# Patient Record
Sex: Male | Born: 1995 | Race: Black or African American | Hispanic: No | Marital: Single | Smoking: Never smoker
Health system: Southern US, Community
[De-identification: ages and names within clinical notes are randomized; demographics above are authoritative.]

## PROBLEM LIST (undated history)

## (undated) ENCOUNTER — Emergency Department (HOSPITAL_COMMUNITY): Disposition: A | Discharge status: Home / Self Care | Payer: Federal, State, Local not specified - PPO

---
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
## 2013-01-30 ENCOUNTER — Other Ambulatory Visit: Payer: Self-pay

## 2013-02-21 ENCOUNTER — Ambulatory Visit: Payer: Federal, State, Local not specified - PPO | Admitting: Family Medicine

## 2013-02-21 ENCOUNTER — Emergency Department (HOSPITAL_COMMUNITY)
Admission: EM | Admit: 2013-02-21 | Discharge: 2013-02-21 | Disposition: A | Discharge status: Home / Self Care | Payer: Federal, State, Local not specified - PPO | Attending: Emergency Medicine | Admitting: Emergency Medicine

## 2013-02-21 ENCOUNTER — Encounter: Payer: Self-pay | Admitting: Family Medicine

## 2013-02-21 ENCOUNTER — Emergency Department (HOSPITAL_COMMUNITY): Payer: Federal, State, Local not specified - PPO

## 2013-02-21 ENCOUNTER — Encounter (HOSPITAL_COMMUNITY): Payer: Self-pay | Admitting: Emergency Medicine

## 2013-02-21 ENCOUNTER — Ambulatory Visit (INDEPENDENT_AMBULATORY_CARE_PROVIDER_SITE_OTHER): Payer: Federal, State, Local not specified - PPO | Admitting: Family Medicine

## 2013-02-21 VITALS — BP 115/68 | HR 77 | Temp 97.8°F | Wt 156.0 lb

## 2013-02-21 DIAGNOSIS — K920 Hematemesis: Secondary | ICD-10-CM | POA: Insufficient documentation

## 2013-02-21 DIAGNOSIS — J111 Influenza due to unidentified influenza virus with other respiratory manifestations: Secondary | ICD-10-CM | POA: Insufficient documentation

## 2013-02-21 DIAGNOSIS — J029 Acute pharyngitis, unspecified: Secondary | ICD-10-CM | POA: Insufficient documentation

## 2013-02-21 DIAGNOSIS — E876 Hypokalemia: Secondary | ICD-10-CM

## 2013-02-21 DIAGNOSIS — A084 Viral intestinal infection, unspecified: Secondary | ICD-10-CM

## 2013-02-21 DIAGNOSIS — R042 Hemoptysis: Secondary | ICD-10-CM

## 2013-02-21 DIAGNOSIS — R197 Diarrhea, unspecified: Secondary | ICD-10-CM | POA: Insufficient documentation

## 2013-02-21 DIAGNOSIS — A088 Other specified intestinal infections: Secondary | ICD-10-CM

## 2013-02-21 LAB — CBC WITH DIFFERENTIAL/PLATELET
Basophils Absolute: 0 10*3/uL (ref 0.0–0.1)
Eosinophils Absolute: 0.3 10*3/uL (ref 0.0–0.7)
Eosinophils Relative: 7 % — ABNORMAL HIGH (ref 0–5)
HCT: 43 % (ref 39.0–52.0)
Lymphocytes Relative: 45 % (ref 12–46)
Lymphs Abs: 1.8 10*3/uL (ref 0.7–4.0)
MCH: 27.4 pg (ref 26.0–34.0)
MCV: 82.9 fL (ref 78.0–100.0)
Monocytes Absolute: 0.6 10*3/uL (ref 0.1–1.0)
RDW: 13.3 % (ref 11.5–15.5)
WBC: 4.1 10*3/uL (ref 4.0–10.5)

## 2013-02-21 LAB — BASIC METABOLIC PANEL
CO2: 30 mEq/L (ref 19–32)
Calcium: 9.2 mg/dL (ref 8.4–10.5)
Creatinine, Ser: 1.01 mg/dL (ref 0.50–1.35)
Glucose, Bld: 95 mg/dL (ref 70–99)

## 2013-02-21 MED ORDER — BENZONATATE 100 MG PO CAPS: 100.0000 mg | ORAL_CAPSULE | Freq: Three times a day (TID) | ORAL | Status: DC

## 2013-02-21 NOTE — Patient Instructions (Signed)
Potassium Content of Foods Potassium is a mineral found in many foods and drinks. It helps keep fluids and minerals balanced in your body and also affects how steadily your heart beats. The body needs potassium to control blood pressure and to keep the muscles and nervous system healthy. However, certain health conditions and medicine may require you to eat more or less potassium-rich foods and drinks. Your caregiver or dietitian will tell you how much potassium you should have each day. COMMON SERVING SIZES The list below tells you how big or small common portion sizes are:  1 oz.........4 stacked dice.  3 oz.........Deck of cards.  1 tsp........Tip of little finger.  1 tbsp......Thumb.  2 tbsp......Golf ball.   c...........Half of a fist.  1 c............A fist. FOODS AND DRINKS HIGH IN POTASSIUM More than 200 mg of potassium per serving. A serving size is  c (120 mL or noted gram weight) unless otherwise stated. While all the items on this list are high in potassium, some items are higher in potassium than others. Fruits  Apricots (sliced), 83 g.  Apricots (dried halves), 3 oz / 24 g.  Avocado (cubed),  c / 50 g.  Banana (sliced), 75 g.  Cantaloupe (cubed), 80 g.  Dates (pitted), 5 whole / 35 g.  Figs (dried), 4 whole / 32 g.  Guava, c / 55 g.  Honeydew, 1 wedge / 85 g.  Kiwi (sliced), 90 g.  Nectarine, 1 small / 129 g.  Orange, 1 medium / 131 g.  Orange juice.  Pomegranate seeds, 87 g.  Pomegranate juice.  Prunes (pitted), 3 whole / 30 g.  Prune juice, 3 oz / 90 mL.  Seedless raisins, 3 tbsp / 27 g. Vegetables  Artichoke,  of a medium / 64 g.  Asparagus (boiled), 90 g.  Baked beans,  c / 63 g.  Bamboo shoots,  c / 38 g.  Beets (cooked slices), 85 g.  Broccoli (boiled), 78 g.  Brussels sprout (boiled), 78 g.  Butternut squash (baked), 103 g.  Chickpea (cooked), 82 g.  Green peas (cooked), 80 g.  Hubbard squash (baked cubes),  c /  68 g.  Kidney beans (cooked), 5 tbsp / 55 g.  Lima beans (cooked),  c / 43 g.  Navy beans (cooked),  c / 61 g.  Potato (baked), 61 g.  Potato (boiled), 78 g.  Pumpkin (boiled), 123 g.  Refried beans,  c / 79 g.  Spinach (cooked),  c / 45 g.  Split peas (cooked),  c / 65 g.  Sun-dried tomatoes, 2 tbsp / 7 g.  Sweet potato (baked),  c / 50 g.  Tomato (chopped or sliced), 90 g.  Tomato juice.  Tomato paste, 4 tsp / 21 g.  Tomato sauce,  c / 61 g.  Vegetable juice.  White mushrooms (cooked), 78 g.  Yam (cooked or baked),  c / 34 g.  Zucchini squash (boiled), 90 g. Other Foods and Drinks  Almonds (whole),  c / 36 g.  Cashews (oil roasted),  c / 32 g.  Chocolate milk.  Chocolate pudding, 142 g.  Clams (steamed), 1.5 oz / 43 g.  Dark chocolate, 1.5 oz / 42 g.  Fish, 3 oz / 85 g.  King crab (steamed), 3 oz / 85 g.  Lobster (steamed), 4 oz / 113 g.  Milk (skim, 1%, 2%, whole), 1 c / 240 mL.  Milk chocolate, 2.3 oz / 66 g.  Milk shake.  Nonfat fruit   variety yogurt, 123 g.  Peanuts (oil roasted), 1 oz / 28 g.  Peanut butter, 2 tbsp / 32 g.  Pistachio nuts, 1 oz / 28 g.  Pumpkin seeds, 1 oz / 28 g.  Red meat (broiled, cooked, grilled), 3 oz / 85 g.  Scallops (steamed), 3 oz / 85 g.  Shredded wheat cereal (dry), 3 oblong biscuits / 75 g.  Spaghetti sauce,  c / 66 g.  Sunflower seeds (dry roasted), 1 oz / 28 g.  Veggie burger, 1 patty / 70 g. FOODS MODERATE IN POTASSIUM Between 150 mg and 200 mg per serving. A serving is  c (120 mL or noted gram weight) unless otherwise stated. Fruits  Grapefruit,  of the fruit / 123 g.  Grapefruit juice.  Pineapple juice.  Plums (sliced), 83 g.  Tangerine, 1 large / 120 g. Vegetables  Carrots (boiled), 78 g.  Carrots (sliced), 61 g.  Rhubarb (cooked with sugar), 120 g.  Rutabaga (cooked), 120 g.  Sweet corn (cooked), 75 g.  Yellow snap beans (cooked), 63 g. Other Foods and  Drinks   Bagel, 1 bagel / 98 g.  Chicken breast (roasted and chopped),  c / 70 g.  Chocolate ice cream / 66 g.  Pita bread, 1 large / 64 g.  Shrimp (steamed), 4 oz / 113 g.  Swiss cheese (diced), 70 g.  Vanilla ice cream, 66 g.  Vanilla pudding, 140 g. FOODS LOW IN POTASSIUM Less than 150 mg per serving. A serving size is  cup (120 mL or noted gram weight) unless otherwise stated. If you eat more than 1 serving of a food low in potassium, the food may be considered a food high in potassium. Fruits  Apple (slices), 55 g.  Apple juice.  Applesauce, 122 g.  Blackberries, 72 g.  Blueberries, 74 g.  Cranberries, 50 g.  Cranberry juice.  Fruit cocktail, 119 g.  Fruit punch.  Grapes, 46 g.  Grape juice.  Mandarin oranges (canned), 126 g.  Peach (slices), 77 g.  Pineapple (chunks), 83 g.  Raspberries, 62 g.  Red cherries (without pits), 78 g.  Strawberries (sliced), 83 g.  Watermelon (diced), 76 g. Vegetables  Alfalfa sprouts, 17 g.  Bell peppers (sliced), 46 g.  Cabbage (shredded), 35 g.  Cauliflower (boiled), 62 g.  Celery, 51 g.  Collard greens (boiled), 95 g.  Cucumber (sliced), 52 g.  Eggplant (cubed), 41 g.  Green beans (boiled), 63 g.  Lettuce (shredded), 1 c / 36 g.  Onions (sauteed), 44 g.  Radishes (sliced), 58 g.  Spaghetti squash, 51 g. Other Foods and Drinks  Angel food cake, 1 slice / 28 g.  Black tea.  Suniga rice (cooked), 98 g.  Butter croissant, 1 medium / 57 g.  Carbonated soda.  Coffee.  Cheddar cheese (diced), 66 g.  Corn flake cereal (dry), 14 g.  Cottage cheese, 118 g.  Cream of rice cereal (cooked), 122 g.  Cream of wheat cereal (cooked), 126 g.  Crisped rice cereal (dry), 14 g.  Egg (boiled, fried, poached, omelet, scrambled), 1 large / 46 61 g.  English muffin, 1 muffin / 57 g.  Frozen ice pop, 1 pop / 55 g.  Graham cracker, 1 large rectangular cracker / 14 g.  Jelly beans, 112  g.  Non-dairy whipped topping.  Oatmeal, 88 g.  Orange sherbet, 74 g.  Puffed rice cereal (dry), 7 g.  Pasta (cooked), 70 g.  Rice cakes, 4 cakes / 36   g.  Sugared doughnut, 4 oz / 116 g.  White bread, 1 slice / 30 g.  White rice (cooked), 79 93 g.  Wild rice (cooked), 82 g.  Yellow cake, 1 slice / 68 g. Document Released: 10/25/2004 Document Revised: 02/28/2012 Document Reviewed: 07/28/2011 [employer] Patient Information 2014 [employer].  

## 2013-02-21 NOTE — Progress Notes (Signed)
   Subjective:    Patient ID: Erik Yang, male    DOB: [birth year], 17 y.o.   MRN: [id number]  HPI Emergency room followup. Patient developed couple days ago some vomiting and diarrhea. Nonbloody emesis. He was having several nonbloody stools. Yesterday coughed up a few red streaks of blood in sputum. No pleuritic pain. No dyspnea. No fever.  Patient had some cough for about one week. Cough worse at night. Nonsmoker. Patient states vomiting and diarrhea have resolved today. He had chest x-ray in emergency department and that was reviewed. No acute findings. Potassium 3.3. White blood cell count normal.  Patient is drinking and eating better today. No abdominal pain. No dyspnea. No pleuritic pain. No palpitations. No further hemoptysis today. No recent fevers or chills or night sweats.  Patient's girlfriend had similar symptoms couple days ago with vomiting and diarrhea. 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 He was prescribed Tessalon Perles but has not yet taken  No past medical history on file. No past surgical history on file.  reports that he has never smoked. He does not have any smokeless tobacco history on file. He reports that he does not drink alcohol or use illicit drugs. family history is not on file. No Known Allergies    Review of Systems  Constitutional: Positive for fatigue. Negative for fever and chills.  HENT: Negative for congestion.   Respiratory: Positive for cough. Negative for shortness of breath.   Gastrointestinal: Positive for nausea, vomiting and diarrhea. Negative for abdominal pain, blood in stool and abdominal distention.  Endocrine: Negative for polydipsia and polyuria.  Neurological: Negative for dizziness and syncope.       Objective:   Physical Exam  Constitutional: He appears well-developed and well-nourished.  HENT:  Mouth/Throat: Oropharynx is clear and moist.  Neck: Neck supple.  No thyromegaly present.  Cardiovascular: Normal rate and regular rhythm.  Exam reveals no gallop.   No murmur heard. Pulmonary/Chest: Effort normal and breath sounds normal. No respiratory distress. He has no wheezes. He has no rales.  Abdominal: Soft. Bowel sounds are normal. He exhibits no distension and no mass. There is no tenderness. There is no rebound and no guarding.  Musculoskeletal: He exhibits no edema.  Lymphadenopathy:    He has no cervical adenopathy.  Skin: No rash noted.          Assessment & Plan:  #1 resolving viral gastroenteritis. Vomiting and diarrhea have resolved at this time. Bland diet and advance slowly as tolerated. #2 cough with episode of small volume hemoptysis probably related to acute viral bronchitis. Nonfocal exam and recent chest x-ray unremarkable. He does not have any red flags for worrisome issues such as pulmonary embolus and has no fever this time. Continue Tessalon as needed for cough #3 mild hypokalemia related to #1. Potassium rich diet recommended

## 2013-02-21 NOTE — Progress Notes (Signed)
Pre visit review using our clinic review tool, if applicable. No additional management support is needed unless otherwise documented below in the visit note. 

## 2013-02-21 NOTE — ED Notes (Addendum)
Pt c/o blood in urine, vomit and sputum.  Pt c/o constant emesis 11/27, Pt c/o of congested cough

## 2013-02-21 NOTE — ED Provider Notes (Signed)
CSN: [id number]     Arrival date & time 02/21/13  0211 History   First MD Initiated Contact with Patient 02/21/13 [phone]     Chief Complaint  Patient presents with  . Hemoptysis  . Hematemesis   (Consider location/radiation/quality/duration/timing/severity/associated sxs/prior Treatment) HPI Comments: Starting having small red streaks in sputum yesterday  Patient is a 17 y.o. male presenting with URI.  URI Presenting symptoms: congestion, cough, rhinorrhea and sore throat   Presenting symptoms: no fatigue and no fever   Severity:  Moderate Duration:  1 week Timing:  Constant Progression:  Worsening Chronicity:  New Relieved by:  Nothing Worsened by:  Nothing tried Associated symptoms: no headaches, no neck pain, no swollen glands and no wheezing   Associated symptoms comment:  Vomiting, diarrhea Risk factors: no immunosuppression, no recent illness, no recent travel and no sick contacts     History reviewed. No pertinent past medical history. History reviewed. No pertinent past surgical history. History reviewed. No pertinent family history. History  Substance Use Topics  . Smoking status: Never Smoker   . Smokeless tobacco: Not on file  . Alcohol Use: No    Review of Systems  Constitutional: Negative for fever, activity change, appetite change and fatigue.  HENT: Positive for congestion, rhinorrhea and sore throat. Negative for facial swelling and trouble swallowing.   Eyes: Negative for photophobia and pain.  Respiratory: Positive for cough. Negative for chest tightness, shortness of breath and wheezing.   Cardiovascular: Negative for chest pain and leg swelling.  Gastrointestinal: Negative for nausea, vomiting, abdominal pain, diarrhea and constipation.  Endocrine: Negative for polydipsia and polyuria.  Genitourinary: Negative for dysuria, urgency, decreased urine volume and difficulty urinating.  Musculoskeletal: Negative for back pain, gait problem and neck pain.   Skin: Negative for color change, rash and wound.  Allergic/Immunologic: Negative for immunocompromised state.  Neurological: Negative for dizziness, facial asymmetry, speech difficulty, weakness, numbness and headaches.  Psychiatric/Behavioral: Negative for confusion, decreased concentration and agitation.    Allergies  Review of patient's allergies indicates no known allergies.  Home Medications   Current Outpatient Rx  Name  Route  Sig  Dispense  Refill  . acetaminophen (TYLENOL) 500 MG tablet   Oral   Take 1,000 mg by mouth every 6 (six) hours as needed for pain.         . benzonatate (TESSALON) 100 MG capsule   Oral   Take 1 capsule (100 mg total) by mouth every 8 (eight) hours.   21 capsule   0   . clindamycin (CLEOCIN) 150 MG capsule   Oral   Take 1 capsule (150 mg total) by mouth every 6 (six) hours.   28 capsule   0   . HYDROcodone-acetaminophen (NORCO/VICODIN) 5-325 MG per tablet   Oral   Take 2 tablets by mouth every 4 (four) hours as needed for pain.   10 tablet   0   . naproxen sodium (ANAPROX) 220 MG tablet   Oral   Take 40 mg by mouth 2 (two) times daily as needed (for pain).         . Pseudoeph-Doxylamine-DM-APAP (NYQUIL PO)   Oral   Take 30 mLs by mouth at bedtime as needed (for tooth pain).          BP 137/82  Pulse 81  Temp(Src) 98.3 F (36.8 C) (Oral)  Resp 16  Ht 5\' 9"  (1.753 m)  Wt 150 lb (68.04 kg)  BMI 22.14 kg/m2  SpO2 100% Physical Exam  Constitutional: He is oriented to person, place, and time. He appears well-developed and well-nourished. No distress.  HENT:  Head: Normocephalic and atraumatic.  Mouth/Throat: No oropharyngeal exudate.  Eyes: Pupils are equal, round, and reactive to light.  Neck: Normal range of motion. Neck supple.  Cardiovascular: Normal rate, regular rhythm and normal heart sounds.  Exam reveals no gallop and no friction rub.   No murmur heard. Pulmonary/Chest: Effort normal and breath sounds normal.  No respiratory distress. He has no wheezes. He has no rales.  Abdominal: Soft. Bowel sounds are normal. He exhibits no distension and no mass. There is no tenderness. There is no rebound and no guarding.  Musculoskeletal: Normal range of motion. He exhibits no edema and no tenderness.  Neurological: He is alert and oriented to person, place, and time.  Skin: Skin is warm and dry.  Psychiatric: He has a normal mood and affect.    ED Course  Procedures (including critical care time) Labs Review Labs Reviewed  CBC WITH DIFFERENTIAL - Abnormal; Notable for the following:    Neutrophils Relative % 34 (*)    Neutro Abs 1.4 (*)    Monocytes Relative 15 (*)    Eosinophils Relative 7 (*)    All other components within normal limits  BASIC METABOLIC PANEL - Abnormal; Notable for the following:    Potassium 3.3 (*)    All other components within normal limits  URINALYSIS, ROUTINE W REFLEX MICROSCOPIC   Imaging Review Dg Chest 2 View  02/21/2013   CLINICAL DATA:  hemoptysis, hematemesis  EXAM: CHEST  2 VIEW  COMPARISON:  Prior radiograph from 04/26/2007.  FINDINGS: The cardiac and mediastinal silhouettes are stable in size and contour, and remain within normal limits.  The lungs are normally inflated. No airspace consolidation, pleural effusion, or pulmonary edema is identified. There is no pneumothorax.  No acute osseous abnormality identified.  IMPRESSION: No active cardiopulmonary disease.   Electronically Signed   By: Rise Mu M.D.   On: 02/21/2013 03:16    EKG Interpretation   None       MDM   1. Influenza-like illness    Pt is a 17 y.o. male with Pmhx as above who presents with 1 week of productive cough, yesterday sputum bean to have small red streaks.  He had several episodes of non-bloody non-bilious emesis yesterday as well as several episodes of d/a. No sick contacts, no fever, +chills.  No CP, SOB, leg swelling rashes.  On PE, VSS, pt in NAD.  Lungs clear.  CXR  unremarkable. Hb stable. WBC nml.  I suspect influenza like illness. Pt can continued supportive care at home w/ OTC meds, PO fluids. Rx for tessalon given.          Shanna Cisco, MD 02/21/13 [phone]

## 2013-07-14 ENCOUNTER — Ambulatory Visit (INDEPENDENT_AMBULATORY_CARE_PROVIDER_SITE_OTHER): Payer: Federal, State, Local not specified - PPO | Admitting: Family Medicine

## 2013-07-14 ENCOUNTER — Encounter: Payer: Self-pay | Admitting: Family Medicine

## 2013-07-14 ENCOUNTER — Ambulatory Visit (HOSPITAL_COMMUNITY)
Admission: RE | Admit: 2013-07-14 | Discharge: 2013-07-14 | Disposition: A | Discharge status: Home / Self Care | Payer: Federal, State, Local not specified - PPO | Attending: Psychiatry | Admitting: Psychiatry

## 2013-07-14 VITALS — BP 130/80 | HR 71 | Temp 98.5°F | Ht 69.0 in | Wt 150.0 lb

## 2013-07-14 DIAGNOSIS — F4310 Post-traumatic stress disorder, unspecified: Secondary | ICD-10-CM

## 2013-07-14 DIAGNOSIS — R32 Unspecified urinary incontinence: Secondary | ICD-10-CM | POA: Insufficient documentation

## 2013-07-14 DIAGNOSIS — R350 Frequency of micturition: Secondary | ICD-10-CM

## 2013-07-14 LAB — POCT URINALYSIS DIPSTICK
BILIRUBIN UA: NEGATIVE
GLUCOSE UA: NEGATIVE
Ketones, UA: NEGATIVE
LEUKOCYTES UA: NEGATIVE
NITRITE UA: NEGATIVE
RBC UA: NEGATIVE
Spec Grav, UA: >=1.03
UROBILINOGEN UA: 0.2
pH, UA: 6
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
## 2013-07-14 NOTE — Progress Notes (Signed)
Pre visit review using our clinic review tool, if applicable. No additional management support is needed unless otherwise documented below in the visit note. 
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
## 2013-07-24 ENCOUNTER — Ambulatory Visit (INDEPENDENT_AMBULATORY_CARE_PROVIDER_SITE_OTHER): Payer: Federal, State, Local not specified - PPO | Admitting: Psychiatry

## 2013-07-24 DIAGNOSIS — F4310 Post-traumatic stress disorder, unspecified: Secondary | ICD-10-CM

## 2013-08-07 ENCOUNTER — Ambulatory Visit (HOSPITAL_COMMUNITY): Payer: Self-pay | Admitting: Psychiatry

## 2013-08-13 ENCOUNTER — Ambulatory Visit (INDEPENDENT_AMBULATORY_CARE_PROVIDER_SITE_OTHER): Payer: Federal, State, Local not specified - PPO | Admitting: Psychology

## 2013-08-13 DIAGNOSIS — F4310 Post-traumatic stress disorder, unspecified: Secondary | ICD-10-CM

## 2013-08-27 ENCOUNTER — Ambulatory Visit (INDEPENDENT_AMBULATORY_CARE_PROVIDER_SITE_OTHER): Payer: Federal, State, Local not specified - PPO | Admitting: Psychology

## 2013-08-27 DIAGNOSIS — F4310 Post-traumatic stress disorder, unspecified: Secondary | ICD-10-CM

## 2013-08-27 DIAGNOSIS — F329 Major depressive disorder, single episode, unspecified: Secondary | ICD-10-CM

## 2013-08-27 DIAGNOSIS — F3289 Other specified depressive episodes: Secondary | ICD-10-CM

## 2013-09-05 ENCOUNTER — Ambulatory Visit (INDEPENDENT_AMBULATORY_CARE_PROVIDER_SITE_OTHER): Payer: Federal, State, Local not specified - PPO | Admitting: Psychology

## 2013-09-05 DIAGNOSIS — F4310 Post-traumatic stress disorder, unspecified: Secondary | ICD-10-CM

## 2013-09-05 DIAGNOSIS — F329 Major depressive disorder, single episode, unspecified: Secondary | ICD-10-CM

## 2013-09-05 DIAGNOSIS — F3289 Other specified depressive episodes: Secondary | ICD-10-CM

## 2013-09-19 ENCOUNTER — Ambulatory Visit (INDEPENDENT_AMBULATORY_CARE_PROVIDER_SITE_OTHER): Payer: Federal, State, Local not specified - PPO | Admitting: Psychology

## 2013-09-19 DIAGNOSIS — F4310 Post-traumatic stress disorder, unspecified: Secondary | ICD-10-CM

## 2013-10-03 ENCOUNTER — Ambulatory Visit (INDEPENDENT_AMBULATORY_CARE_PROVIDER_SITE_OTHER): Payer: Federal, State, Local not specified - PPO | Admitting: Psychology

## 2013-10-03 DIAGNOSIS — F4310 Post-traumatic stress disorder, unspecified: Secondary | ICD-10-CM

## 2013-10-10 ENCOUNTER — Ambulatory Visit: Payer: Federal, State, Local not specified - PPO | Admitting: Psychology

## 2013-10-17 ENCOUNTER — Ambulatory Visit (INDEPENDENT_AMBULATORY_CARE_PROVIDER_SITE_OTHER): Payer: Federal, State, Local not specified - PPO | Admitting: Psychology

## 2013-10-17 DIAGNOSIS — F4310 Post-traumatic stress disorder, unspecified: Secondary | ICD-10-CM

## 2013-10-24 ENCOUNTER — Ambulatory Visit (INDEPENDENT_AMBULATORY_CARE_PROVIDER_SITE_OTHER): Payer: Federal, State, Local not specified - PPO | Admitting: Psychology

## 2013-10-24 DIAGNOSIS — F4310 Post-traumatic stress disorder, unspecified: Secondary | ICD-10-CM

## 2013-11-14 ENCOUNTER — Ambulatory Visit (INDEPENDENT_AMBULATORY_CARE_PROVIDER_SITE_OTHER): Payer: Federal, State, Local not specified - PPO | Admitting: Psychology

## 2013-11-14 DIAGNOSIS — F4310 Post-traumatic stress disorder, unspecified: Secondary | ICD-10-CM

## 2013-11-28 ENCOUNTER — Ambulatory Visit: Payer: Federal, State, Local not specified - PPO | Admitting: Psychology

## 2014-02-09 ENCOUNTER — Emergency Department (HOSPITAL_COMMUNITY)
Admission: EM | Admit: 2014-02-09 | Discharge: 2014-02-09 | Disposition: A | Discharge status: Home / Self Care | Payer: Federal, State, Local not specified - PPO | Attending: Emergency Medicine | Admitting: Emergency Medicine

## 2014-02-09 ENCOUNTER — Encounter (HOSPITAL_COMMUNITY): Payer: Self-pay | Admitting: *Deleted

## 2014-02-09 DIAGNOSIS — Y92410 Unspecified street and highway as the place of occurrence of the external cause: Secondary | ICD-10-CM | POA: Insufficient documentation

## 2014-02-09 DIAGNOSIS — Y9389 Activity, other specified: Secondary | ICD-10-CM | POA: Insufficient documentation

## 2014-02-09 DIAGNOSIS — Y998 Other external cause status: Secondary | ICD-10-CM | POA: Insufficient documentation

## 2014-02-09 DIAGNOSIS — S199XXA Unspecified injury of neck, initial encounter: Secondary | ICD-10-CM | POA: Insufficient documentation

## 2014-02-09 DIAGNOSIS — S3992XA Unspecified injury of lower back, initial encounter: Secondary | ICD-10-CM | POA: Diagnosis present

## 2014-02-09 DIAGNOSIS — S39012A Strain of muscle, fascia and tendon of lower back, initial encounter: Secondary | ICD-10-CM | POA: Diagnosis not present

## 2014-02-09 MED ORDER — HYDROCODONE-ACETAMINOPHEN 5-325 MG PO TABS: 2.0000 | ORAL_TABLET | ORAL | Status: DC | PRN

## 2014-02-09 MED ORDER — CYCLOBENZAPRINE HCL 10 MG PO TABS: 10.0000 mg | ORAL_TABLET | Freq: Two times a day (BID) | ORAL | Status: DC | PRN
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
## 2014-02-09 NOTE — Discharge Instructions (Signed)
Take vicodin as needed for pain. Take flexeril as needed for muscle spasm. You may take these medications together. Refer to attached documents for more information.

## 2014-07-09 ENCOUNTER — Ambulatory Visit: Payer: Federal, State, Local not specified - PPO | Admitting: Family Medicine

## 2014-09-03 ENCOUNTER — Emergency Department (HOSPITAL_COMMUNITY)
Admission: EM | Admit: 2014-09-03 | Discharge: 2014-09-03 | Disposition: A | Discharge status: Home / Self Care | Payer: Federal, State, Local not specified - PPO | Attending: Emergency Medicine | Admitting: Emergency Medicine

## 2014-09-03 ENCOUNTER — Encounter (HOSPITAL_COMMUNITY): Payer: Self-pay | Admitting: Cardiology

## 2014-09-03 DIAGNOSIS — Y998 Other external cause status: Secondary | ICD-10-CM | POA: Insufficient documentation

## 2014-09-03 DIAGNOSIS — S40861A Insect bite (nonvenomous) of right upper arm, initial encounter: Secondary | ICD-10-CM | POA: Diagnosis not present

## 2014-09-03 DIAGNOSIS — Y9289 Other specified places as the place of occurrence of the external cause: Secondary | ICD-10-CM | POA: Diagnosis not present

## 2014-09-03 DIAGNOSIS — S30860A Insect bite (nonvenomous) of lower back and pelvis, initial encounter: Secondary | ICD-10-CM | POA: Diagnosis not present

## 2014-09-03 DIAGNOSIS — S90562A Insect bite (nonvenomous), left ankle, initial encounter: Secondary | ICD-10-CM | POA: Diagnosis not present

## 2014-09-03 DIAGNOSIS — S90561A Insect bite (nonvenomous), right ankle, initial encounter: Secondary | ICD-10-CM | POA: Insufficient documentation

## 2014-09-03 DIAGNOSIS — Y9389 Activity, other specified: Secondary | ICD-10-CM | POA: Diagnosis not present

## 2014-09-03 DIAGNOSIS — Z7952 Long term (current) use of systemic steroids: Secondary | ICD-10-CM | POA: Insufficient documentation

## 2014-09-03 DIAGNOSIS — W57XXXA Bitten or stung by nonvenomous insect and other nonvenomous arthropods, initial encounter: Secondary | ICD-10-CM | POA: Insufficient documentation

## 2014-09-03 DIAGNOSIS — Z79899 Other long term (current) drug therapy: Secondary | ICD-10-CM | POA: Insufficient documentation

## 2014-09-03 MED ORDER — LORATADINE 10 MG PO TABS: 10.0000 mg | ORAL_TABLET | Freq: Every day | ORAL | Status: DC

## 2014-09-03 MED ORDER — PREDNISONE 10 MG PO TABS: 20.0000 mg | ORAL_TABLET | Freq: Two times a day (BID) | ORAL | Status: DC

## 2014-09-03 NOTE — ED Notes (Signed)
Pt has multiple bug bites to back ,legs and RT upper arm. Pt reports he wakes up while sleeping because of bug bites. Pain 7/10

## 2014-09-03 NOTE — ED Notes (Signed)
Pt reports bites marks to the right arm and back. Unsure of what may have bitten him.

## 2014-09-03 NOTE — Discharge Instructions (Signed)
You make take benadryl at night for itching if needed in addition to the other medications.

## 2014-09-03 NOTE — ED Provider Notes (Signed)
CSN: [id number]     Arrival date & time 09/03/14  1107 History   This chart was scribed for non-physician practitioner working with Arby Barrette, MD by Lyndel Safe, ED Scribe. This patient was seen in room TR08C/TR08C and the patient's care was started at 1:26 PM.  Chief Complaint  Patient presents with  . Insect Bite   HPI  HPI Comments: Erik Yang is a 19 y.o. male who presents to the Emergency Department complaining of constant, moderate itching onset 2 days ago with associated right upper arm, ankles bilaterally, and generalized back rash that he describes as a burning rash. Pt states he works outside. He notes he has a roommate but that the roommate has not experienced any itching or rash symptoms. He denies visualizing any bugs in his bed but that he has sprayed for bed bugs. Pt also denies fever, any rash or itching on his face, in between his fingers, in between his toes, or pelvic/genital region.   History reviewed. No pertinent past medical history. History reviewed. No pertinent past surgical history. History reviewed. No pertinent family history. History  Substance Use Topics  . Smoking status: Never Smoker   . Smokeless tobacco: Never Used  . Alcohol Use: No    Review of Systems  Constitutional: Negative for fever.  Skin: Positive for rash. Negative for color change.  All other systems reviewed and are negative.     Allergies  Review of patient's allergies indicates no known allergies.  Home Medications   Prior to Admission medications   Medication Sig Start Date End Date Taking? Authorizing Provider  benzonatate (TESSALON) 100 MG capsule Take 1 capsule (100 mg total) by mouth every 8 (eight) hours. Patient not taking: Reported on 02/09/2014 02/21/13   Toy Cookey, MD  clindamycin (CLEOCIN) 150 MG capsule Take 1 capsule (150 mg total) by mouth every 6 (six) hours. Patient not taking: Reported on 02/09/2014 07/08/12   Earley Favor, NP  cyclobenzaprine  (FLEXERIL) 10 MG tablet Take 1 tablet (10 mg total) by mouth 2 (two) times daily as needed for muscle spasms. 02/09/14   Emilia Beck, PA-C  HYDROcodone-acetaminophen (NORCO/VICODIN) 5-325 MG per tablet Take 2 tablets by mouth every 4 (four) hours as needed for moderate pain or severe pain. 02/09/14   Kaitlyn Szekalski, PA-C  loratadine (CLARITIN) 10 MG tablet Take 1 tablet (10 mg total) by mouth daily. 09/03/14   Mariellen Blaney Orlene Och, NP  naproxen sodium (ANAPROX) 220 MG tablet Take 40 mg by mouth 2 (two) times daily as needed (for pain).    Historical Provider, MD  predniSONE (DELTASONE) 10 MG tablet Take 2 tablets (20 mg total) by mouth 2 (two) times daily with a meal. 09/03/14   Ida Uppal Orlene Och, NP  Pseudoeph-Doxylamine-DM-APAP (NYQUIL PO) Take 30 mLs by mouth at bedtime as needed (for tooth pain).    Historical Provider, MD   BP 121/64 mmHg  Pulse 70  Temp(Src) 98.2 F (36.8 C) (Oral)  Resp 20  Ht  (1.803 m)  Wt 140 lb (63.504 kg)  BMI 19.53 kg/m2  SpO2 97%  Physical Exam  Constitutional: He is oriented to person, place, and time. He appears well-developed and well-nourished. No distress.  HENT:  Head: Normocephalic.  Right Ear: External ear normal.  Left Ear: External ear normal.  Mouth/Throat: No oropharyngeal exudate.  Eyes: Pupils are equal, round, and reactive to light. Right eye exhibits no discharge. Left eye exhibits no discharge. No scleral icterus.  Neck: No JVD present.  Cardiovascular: Normal rate, regular rhythm and normal heart sounds.   Pulmonary/Chest: Effort normal and breath sounds normal. No respiratory distress.  Musculoskeletal: He exhibits no edema.  See skin exam  Lymphadenopathy:    He has no cervical adenopathy.  Neurological: He is alert and oriented to person, place, and time.  Skin: Skin is warm. Rash noted. No erythema.  Multiple tiny raised areas to forearms, ankles, and a few places to lower back consistent with insect bites.  He has some scabbed  areas where he has been scratching. No red streaking or signs of infection.   Psychiatric: He has a normal mood and affect. His behavior is normal.  Nursing note and vitals reviewed.   ED Course  Procedures  DIAGNOSTIC STUDIES: Oxygen Saturation is 97% on RA, normal by my interpretation.    COORDINATION OF CARE: 1:30 PM Discussed treatment plan which includes to treat for insect bites. Pt acknowledges and agrees to plan.    MDM  19 y.o. male with multiple tiny red raised areas to forearms, ankles and lower back consistent with insect bites. Discussed with the patient clinical findings and plan of care. Discussed that may be insects from where he works outside. Discussed his concern for bedbugs and will give information on that as well. Stable for d/c without signs of skin infection.   Final diagnoses:  Insect bites   I personally performed the services described in this documentation, which was scribed in my presence. The recorded information has been reviewed and is accurate.   [place], NP 09/05/14 1725  Arby Barrette, MD 09/05/14 2330

## 2014-11-30 ENCOUNTER — Encounter (HOSPITAL_COMMUNITY): Payer: Self-pay | Admitting: Emergency Medicine

## 2014-11-30 ENCOUNTER — Emergency Department (HOSPITAL_COMMUNITY): Payer: Federal, State, Local not specified - PPO

## 2014-11-30 ENCOUNTER — Emergency Department (HOSPITAL_COMMUNITY)
Admission: EM | Admit: 2014-11-30 | Discharge: 2014-11-30 | Disposition: A | Discharge status: Home / Self Care | Payer: Federal, State, Local not specified - PPO | Attending: Emergency Medicine | Admitting: Emergency Medicine

## 2014-11-30 DIAGNOSIS — Z79899 Other long term (current) drug therapy: Secondary | ICD-10-CM | POA: Insufficient documentation

## 2014-11-30 DIAGNOSIS — S70312A Abrasion, left thigh, initial encounter: Secondary | ICD-10-CM | POA: Insufficient documentation

## 2014-11-30 DIAGNOSIS — Z23 Encounter for immunization: Secondary | ICD-10-CM | POA: Diagnosis not present

## 2014-11-30 DIAGNOSIS — S79922A Unspecified injury of left thigh, initial encounter: Secondary | ICD-10-CM | POA: Diagnosis present

## 2014-11-30 DIAGNOSIS — W540XXA Bitten by dog, initial encounter: Secondary | ICD-10-CM | POA: Insufficient documentation

## 2014-11-30 DIAGNOSIS — Y9389 Activity, other specified: Secondary | ICD-10-CM | POA: Insufficient documentation

## 2014-11-30 DIAGNOSIS — Y998 Other external cause status: Secondary | ICD-10-CM | POA: Diagnosis not present

## 2014-11-30 DIAGNOSIS — Y9289 Other specified places as the place of occurrence of the external cause: Secondary | ICD-10-CM | POA: Insufficient documentation

## 2014-11-30 MED ORDER — AMOXICILLIN-POT CLAVULANATE 875-125 MG PO TABS: 1.0000 | ORAL_TABLET | Freq: Two times a day (BID) | ORAL | Status: DC

## 2014-11-30 MED ORDER — TETANUS-DIPHTH-ACELL PERTUSSIS 5-2.5-18.5 LF-MCG/0.5 IM SUSP
0.5000 mL | Freq: Once | INTRAMUSCULAR | Status: AC
  Administered 2014-11-30: 0.5 mL via INTRAMUSCULAR
  Filled 2014-11-30: qty 0.5

## 2014-11-30 NOTE — ED Notes (Signed)
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 Sent from work with [employer] as well. Denies any fever, n/v/d. Unable to see puncture wounds.

## 2014-11-30 NOTE — ED Provider Notes (Signed)
CSN: [id number]     Arrival date & time 11/30/14  1840 History  This chart was scribed for non-physician practitioner working Trixie Dredge PA-C with Lavera Guise, MD by Lyndel Safe, ED Scribe. This patient was seen in room TR07C/TR07C and the patient's care was started at 7:02 PM.  Chief Complaint  Patient presents with  . Animal Bite   The history is provided by the patient. No language interpreter was used.   HPI Comments: Erik Yang is a 19 y.o. 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 Pt reports the medium sized dog bit him through athletic shorts. He notes the associated pain is gradually improving but he still feels sharp pain especially with weight bearing. The pt was told the dog is UTD on vaccinations. He presents with workman's comp paperwork. Tetanus not UTD. He denies weakness or numbness in his left leg, fevers or chills. NKDA.  Denies other injury.    History reviewed. No pertinent past medical history. History reviewed. No pertinent past surgical history. No family history on file. Social History  Substance Use Topics  . Smoking status: Never Smoker   . Smokeless tobacco: Never Used  . Alcohol Use: No    Review of Systems  Constitutional: Negative for fever and chills.  Musculoskeletal: Negative for joint swelling and neck pain. Arthralgias:  left, posterior thigh; sight of abrasions.  Skin: Positive for wound ( abrasions).  Allergic/Immunologic: Negative for immunocompromised state.  Neurological: Negative for weakness and numbness.  Hematological: Does not bruise/bleed easily.  Psychiatric/Behavioral: Negative for self-injury.   Allergies  Review of patient's allergies indicates no known allergies.  Home Medications   Prior to Admission medications   Medication Sig Start Date End Date Taking?  Authorizing Provider  benzonatate (TESSALON) 100 MG capsule Take 1 capsule (100 mg total) by mouth every 8 (eight) hours. Patient not taking: Reported on 02/09/2014 02/21/13   Toy Cookey, MD  clindamycin (CLEOCIN) 150 MG capsule Take 1 capsule (150 mg total) by mouth every 6 (six) hours. Patient not taking: Reported on 02/09/2014 07/08/12   Earley Favor, NP  cyclobenzaprine (FLEXERIL) 10 MG tablet Take 1 tablet (10 mg total) by mouth 2 (two) times daily as needed for muscle spasms. 02/09/14   Emilia Beck, PA-C  HYDROcodone-acetaminophen (NORCO/VICODIN) 5-325 MG per tablet Take 2 tablets by mouth every 4 (four) hours as needed for moderate pain or severe pain. 02/09/14   Kaitlyn Szekalski, PA-C  loratadine (CLARITIN) 10 MG tablet Take 1 tablet (10 mg total) by mouth daily. 09/03/14   Hope Orlene Och, NP  naproxen sodium (ANAPROX) 220 MG tablet Take 40 mg by mouth 2 (two) times daily as needed (for pain).    Historical Provider, MD  predniSONE (DELTASONE) 10 MG tablet Take 2 tablets (20 mg total) by mouth 2 (two) times daily with a meal. 09/03/14   Hope Orlene Och, NP  Pseudoeph-Doxylamine-DM-APAP (NYQUIL PO) Take 30 mLs by mouth at bedtime as needed (for tooth pain).    Historical Provider, MD   BP 125/70 mmHg  Pulse 71  Temp(Src) 98 F (36.7 C) (Oral)  Resp 16  Ht  (1.778 m)  Wt 145 lb (65.772 kg)  BMI 20.81 kg/m2  SpO2 100% Physical Exam  Constitutional: He appears well-developed and well-nourished. No distress.  HENT:  Head: Normocephalic and atraumatic.  Neck:  Neck supple.  Pulmonary/Chest: Effort normal.  Musculoskeletal: Normal range of motion. He exhibits no edema.  Neurological: He is alert. He exhibits normal muscle tone.  Skin: He is not diaphoretic.  Left posterior thigh; single area of abrasion and mild surrounding erythema, no tenderness; several inches superior to the area of abrasion there is a second small area of erythema that is mildly TTP. No induration,  fluctuance.  No discharge or bleeding.  Psychiatric: He has a normal mood and affect. His behavior is normal.  Nursing note and vitals reviewed.  ED Course  Procedures  DIAGNOSTIC STUDIES: Oxygen Saturation is 100% on RA, normal by my interpretation.    COORDINATION OF CARE: 7:09 PM Discussed treatment plan which includes to order tetanus injection with pt. Pt acknowledges and agrees to plan.   Labs Review Labs Reviewed - No data to display  Imaging Review Dg Femur Min 2 Views Left  11/30/2014   CLINICAL DATA:  Dog bite to the thigh.  EXAM: LEFT FEMUR 2 VIEWS  COMPARISON:  None.  FINDINGS: No fracture of the LEFT femur.  No foreign body.  IMPRESSION: No fracture or foreign body.   Electronically Signed   By: Genevive Bi M.D.   On: 11/30/2014 20:27   I have personally reviewed and evaluated these images and lab results as part of my medical decision-making.   EKG Interpretation None      MDM   Final diagnoses:  Dog bite    Afebrile, nontoxic patient with reported dog bite to left posterior thigh.  Xray negative. Tdap updated.  Pt declinees rabies vaccination and immunoglobin as dog owner claimed dog UTD on vaccinations - I urged pt to involve animal control or somehow confirm this and if he is not to follow up and get rabies injections.   D/C home with augmentin.  Discussed result, findings, treatment, and follow up  with patient.  Pt given return precautions.  Pt verbalizes understanding and agrees with plan.         I personally performed the services described in this documentation, which was scribed in my presence. The recorded information has been reviewed and is accurate.    Trixie Dredge, PA-C 11/30/14 2224  Lavera Guise, MD 12/01/14 [phone]

## 2014-11-30 NOTE — Discharge Instructions (Signed)
Read the information below.  Use the prescribed medication as directed.  Please discuss all new medications with your pharmacist.  You may return to the Emergency Department at any time for worsening condition or any new symptoms that concern you.  If you develop redness, swelling, pus draining from the wound, or fevers greater than 100.4, return to the ER immediately for a recheck.   ° ° °Animal Bite °An animal bite can result in a scratch on the skin, deep open cut, puncture of the skin, crush injury, or tearing away of the skin or a body part. Dogs are responsible for most animal bites. Children are bitten more often than adults. An animal bite can range from very mild to more serious. A small bite from your house pet is no cause for alarm. However, some animal bites can become infected or injure a bone or other tissue. You must seek medical care if: °· The skin is broken and bleeding does not slow down or stop after 15 minutes. °· The puncture is deep and difficult to clean (such as a cat bite). °· Pain, warmth, redness, or pus develops around the wound. °· The bite is from a stray animal or rodent. There may be a risk of rabies infection. °· The bite is from a snake, raccoon, skunk, fox, coyote, or bat. There may be a risk of rabies infection. °· The person bitten has a chronic illness such as diabetes, liver disease, or cancer, or the person takes medicine that lowers the immune system. °· There is concern about the location and severity of the bite. °It is important to clean and protect an animal bite wound right away to prevent infection. Follow these steps: °· Clean the wound with plenty of water and soap. °· Apply an antibiotic cream. °· Apply gentle pressure over the wound with a clean towel or gauze to slow or stop bleeding. °· Elevate the affected area above the heart to help stop any bleeding. °· Seek medical care. Getting medical care within 8 hours of the animal bite leads to the best possible  outcome. °DIAGNOSIS  °Your caregiver will most likely: °· Take a detailed history of the animal and the bite injury. °· Perform a wound exam. °· Take your medical history. °Blood tests or X-rays may be performed. Sometimes, infected bite wounds are cultured and sent to a lab to identify the infectious bacteria.  °TREATMENT  °Medical treatment will depend on the location and type of animal bite as well as the patient's medical history. Treatment may include: °· Wound care, such as cleaning and flushing the wound with saline solution, bandaging, and elevating the affected area. °· Antibiotics. °· Tetanus immunization. °· Rabies immunization. °· Leaving the wound open to heal. This is often done with animal bites, due to the high risk of infection. However, in certain cases, wound closure with stitches, wound adhesive, skin adhesive strips, or staples may be used. ° Infected bites that are left untreated may require intravenous (IV) antibiotics and surgical treatment in the hospital. °HOME CARE INSTRUCTIONS °· Follow your caregiver's instructions for wound care. °· Take all medicines as directed. °· If your caregiver prescribes antibiotics, take them as directed. Finish them even if you start to feel better. °· Follow up with your caregiver for further exams or immunizations as directed. °You may need a tetanus shot if: °· You cannot remember when you had your last tetanus shot. °· You have never had a tetanus shot. °· The injury broke your   skin. If you get a tetanus shot, your arm may swell, get red, and feel warm to the touch. This is common and not a problem. If you need a tetanus shot and you choose not to have one, there is a rare chance of getting tetanus. Sickness from tetanus can be serious. SEEK MEDICAL CARE IF:  You notice warmth, redness, soreness, swelling, pus discharge, or a bad smell coming from the wound.  You have a red line on the skin coming from the wound.  You have a fever, chills, or a  general ill feeling.  You have nausea or vomiting.  You have continued or worsening pain.  You have trouble moving the injured part.  You have other questions or concerns. MAKE SURE YOU:  Understand these instructions.  Will watch your condition.  Will get help right away if you are not doing well or get worse. Document Released: 11/29/2010 Document Revised: 06/05/2011 Document Reviewed: 11/29/2010 [facility] Patient Information 2015 [place], [place]. This information is not intended to replace advice given to you by your health care provider. Make sure you discuss any questions you have with your health care provider.

## 2014-12-30 ENCOUNTER — Ambulatory Visit

## 2014-12-30 ENCOUNTER — Ambulatory Visit (INDEPENDENT_AMBULATORY_CARE_PROVIDER_SITE_OTHER): Admitting: Family Medicine

## 2014-12-30 VITALS — BP 106/80 | HR 55 | Temp 97.9°F | Resp 18 | Ht 69.0 in | Wt 146.2 lb

## 2014-12-30 DIAGNOSIS — M546 Pain in thoracic spine: Secondary | ICD-10-CM

## 2014-12-30 DIAGNOSIS — S161XXA Strain of muscle, fascia and tendon at neck level, initial encounter: Secondary | ICD-10-CM | POA: Diagnosis not present

## 2014-12-30 DIAGNOSIS — S3992XA Unspecified injury of lower back, initial encounter: Secondary | ICD-10-CM

## 2014-12-30 DIAGNOSIS — S39012A Strain of muscle, fascia and tendon of lower back, initial encounter: Secondary | ICD-10-CM

## 2014-12-30 MED ORDER — CYCLOBENZAPRINE HCL 10 MG PO TABS: 5.0000 mg | ORAL_TABLET | Freq: Three times a day (TID) | ORAL | Status: DC | PRN

## 2014-12-30 MED ORDER — MELOXICAM 15 MG PO TABS: 15.0000 mg | ORAL_TABLET | Freq: Every day | ORAL | Status: DC

## 2014-12-30 NOTE — Progress Notes (Signed)
Erik Yang April 02, 1995 19 y.o.   Chief Complaint  Patient presents with  . Work Related Injury    C/O back injury at work-happened today     Date of Injury: 12/30/2014  History of Present Illness:  Presents for evaluation of work-related complaint.  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  The box was ripping, and as he adjusted for the weight of the box, he felt a pain.  He was able to complete route, but had progressively worsening pain to his neck and midback.  He has no numbness or tingling.  There is some radiation up around the left back of shoulder.  It is aggravated by sitting up, but he still feels it laying down.  He rates pain 7/10 at this time.   ROS ROS otherwise unremarkable unless listed above.     No Known Allergies   Current medications reviewed and updated. Past medical history, family history, social history have been reviewed and updated.   Physical Exam  Constitutional: He is oriented to person, place, and time and well-developed, well-nourished, and in no distress. No distress.  Cardiovascular: Normal rate and regular rhythm.  Exam reveals no friction rub.   No murmur heard. Pulmonary/Chest: Effort normal and breath sounds normal. No respiratory distress. He has no wheezes.  Musculoskeletal: Normal range of motion.  c7-t1 spinous tenderness. Thoracic spinous tenderness.   Musculature spasm with palpation at left musculature near scapula. Normal range of motion throughout.   Pain with external rotation.  No sign of impingement.    Neurological: He is alert and oriented to person, place, and time.  Skin: Skin is warm and dry. He is not diaphoretic.  Psychiatric: Affect and judgment normal.   [facility] reading (PRIMARY) by  Dr. Katrinka Blazing: Negative  Assessment and Plan: 19 year old male is here today for chief complaint of back pain that started this morning after lifting heavy  box. -Likely muscle strain, with possible rotator cuff involvement.  Starting him on flexeril and mobic at this time. -He is placed on a modified light duty with lifting restrictions (see letter). -Advised to ice the back 3-4 times per day for 15 minutes.   -Stretches verbally and written to do three times per day. He will rtn in 3 days for follow up.  Back injury, initial encounter - Plan: DG Cervical Spine Complete, DG Thoracic Spine 2 View, meloxicam (MOBIC) 15 MG tablet, cyclobenzaprine (FLEXERIL) 10 MG tablet, CANCELED: DG Thoracic Spine 2 View  Back strain, initial encounter

## 2014-12-30 NOTE — Patient Instructions (Signed)
Thoracic Strain Thoracic strain is an injury to the muscles or tendons that attach to the upper back. A strain can be mild or severe. A mild strain may take only 1-2 weeks to heal. A severe strain involves torn muscles or tendons, so it may take 6-8 weeks to heal. HOME CARE 1. Rest as needed. Limit your activity as told by your doctor. 2. If directed, put ice on the injured area: 1. Put ice in a plastic bag. 2. Place a towel between your skin and the bag. 3. Leave the ice on for 20 minutes, 2-3 times per day. 3. Take over-the-counter and prescription medicines only as told by your doctor. 4. Begin doing exercises as told by your doctor or physical therapist. 5. Warm up before being active. 6. Bend your knees before you lift heavy objects. 7. Keep all follow-up visits as told by your doctor. This is important. GET HELP IF: 1. Your pain is not helped by medicine. 2. Your pain, bruising, or swelling is getting worse. 3. You have a fever. GET HELP RIGHT AWAY IF: 1. You have shortness of breath. 2. You have chest pain. 3. You have weakness or loss of feeling (numbness) in your legs. 4. You cannot control when you pee (urinate).   This information is not intended to replace advice given to you by your health care provider. Make sure you discuss any questions you have with your health care provider.   Document Released: 08/30/2007 Document Revised: 12/02/2014 Document Reviewed: 05/07/2014 Elsevier Interactive Patient Education 2016 [employer] Back Exercises If you have pain in your back, do these exercises 2-3 times each day or as told by your doctor. When the pain goes away, do the exercises once each day, but repeat the steps more times for each exercise (do more repetitions). If you do not have pain in your back, do these exercises once each day or as told by your doctor. EXERCISES Single Knee to Chest Do these steps 3-5 times in a row for each leg: 8. Lie on your back on a firm bed  or the floor with your legs stretched out. 9. Bring one knee to your chest. 10. Hold your knee to your chest by grabbing your knee or thigh. 11. Pull on your knee until you feel a gentle stretch in your lower back. 12. Keep doing the stretch for 10-30 seconds. 13. Slowly let go of your leg and straighten it. Pelvic Tilt Do these steps 5-10 times in a row: 4. Lie on your back on a firm bed or the floor with your legs stretched out. 5. Bend your knees so they point up to the ceiling. Your feet should be flat on the floor. 6. Tighten your lower belly (abdomen) muscles to press your lower back against the floor. This will make your tailbone point up to the ceiling instead of pointing down to your feet or the floor. 7. Stay in this position for 5-10 seconds while you gently tighten your muscles and breathe evenly. Cat-Cow Do these steps until your lower back bends more easily: 5. Get on your hands and knees on a firm surface. Keep your hands under your shoulders, and keep your knees under your hips. You may put padding under your knees. 6. Let your head hang down, and make your tailbone point down to the floor so your lower back is round like the back of a cat. 7. Stay in this position for 5 seconds. 8. Slowly lift your head and make  your tailbone point up to the ceiling so your back hangs low (sags) like the back of a cow. 9. Stay in this position for 5 seconds. Press-Ups Do these steps 5-10 times in a row: 1. Lie on your belly (face-down) on the floor. 2. Place your hands near your head, about shoulder-width apart. 3. While you keep your back relaxed and keep your hips on the floor, slowly straighten your arms to raise the top half of your body and lift your shoulders. Do not use your back muscles. To make yourself more comfortable, you may change where you place your hands. 4. Stay in this position for 5 seconds. 5. Slowly return to lying flat on the floor. Bridges Do these steps 10 times in  a row: 1. Lie on your back on a firm surface. 2. Bend your knees so they point up to the ceiling. Your feet should be flat on the floor. 3. Tighten your butt muscles and lift your butt off of the floor until your waist is almost as high as your knees. If you do not feel the muscles working in your butt and the back of your thighs, slide your feet 1-2 inches farther away from your butt. 4. Stay in this position for 3-5 seconds. 5. Slowly lower your butt to the floor, and let your butt muscles relax. If this exercise is too easy, try doing it with your arms crossed over your chest. Belly Crunches Do these steps 5-10 times in a row: 1. Lie on your back on a firm bed or the floor with your legs stretched out. 2. Bend your knees so they point up to the ceiling. Your feet should be flat on the floor. 3. Cross your arms over your chest. 4. Tip your chin a little bit toward your chest but do not bend your neck. 5. Tighten your belly muscles and slowly raise your chest just enough to lift your shoulder blades a tiny bit off of the floor. 6. Slowly lower your chest and your head to the floor. Back Lifts Do these steps 5-10 times in a row: 1. Lie on your belly (face-down) with your arms at your sides, and rest your forehead on the floor. 2. Tighten the muscles in your legs and your butt. 3. Slowly lift your chest off of the floor while you keep your hips on the floor. Keep the back of your head in line with the curve in your back. Look at the floor while you do this. 4. Stay in this position for 3-5 seconds. 5. Slowly lower your chest and your face to the floor. GET HELP IF:  Your back pain gets a lot worse when you do an exercise.  Your back pain does not lessen 2 hours after you exercise. If you have any of these problems, stop doing the exercises. Do not do them again unless your doctor says it is okay. GET HELP RIGHT AWAY IF:  You have sudden, very bad back pain. If this happens, stop doing  the exercises. Do not do them again unless your doctor says it is okay.   This information is not intended to replace advice given to you by your health care provider. Make sure you discuss any questions you have with your health care provider.   Document Released: 04/15/2010 Document Revised: 12/02/2014 Document Reviewed: 05/07/2014 Elsevier Interactive Patient Education [employer]

## 2015-01-14 ENCOUNTER — Ambulatory Visit (INDEPENDENT_AMBULATORY_CARE_PROVIDER_SITE_OTHER): Admitting: Emergency Medicine

## 2015-01-14 VITALS — BP 118/76 | HR 77 | Temp 98.6°F | Resp 16 | Ht 70.0 in | Wt 145.0 lb

## 2015-01-14 DIAGNOSIS — S39012D Strain of muscle, fascia and tendon of lower back, subsequent encounter: Secondary | ICD-10-CM | POA: Diagnosis not present

## 2015-01-14 NOTE — Progress Notes (Signed)
   Subjective:  Patient ID: Erik Yang, male    DOB: 04/18/1995  Age: 19 y.o. 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 He has been out of work since that time. He claims to have complete recovery of his pain of his neck injury. He has no pain at full full and free range of motion. There is no radiation of pain numbness tingling or weakness.  History  Review of Systems  Objective:  BP 118/76 mmHg  Pulse 77  Temp(Src) 98.6 F (37 C) (Oral)  Resp 16  Ht 5\' 10"  (1.778 m)  Wt 145 lb (65.772 kg)  BMI 20.81 kg/m2  SpO2 99%  Physical Exam  Constitutional: He is oriented to person, place, and time. He appears well-developed and well-nourished.  HENT:  Head: Normocephalic and atraumatic.  Eyes: Conjunctivae are normal. Pupils are equal, round, and reactive to light.  Pulmonary/Chest: Effort normal.  Musculoskeletal: He exhibits no edema.  Neurological: He is alert and oriented to person, place, and time.  Skin: Skin is dry.  Psychiatric: He has a normal mood and affect. His behavior is normal. Thought content normal.      Assessment & Plan:   Erik Yang was seen today for follow-up.  Diagnoses and all orders for this visit:  Back strain, subsequent encounter   he's been returned to work full duty with no limitations    I have discontinued Erik Yang's naproxen sodium, Pseudoeph-Doxylamine-DM-APAP (NYQUIL PO), HYDROcodone-acetaminophen, predniSONE, loratadine, meloxicam, and cyclobenzaprine.  No orders of the defined types were placed in this encounter.    Appropriate red flag conditions were discussed with the patient as well as actions that should be taken.  Patient expressed his understanding.  Follow-up: Return if symptoms worsen or fail to improve.  Carmelina DaneAnderson, Loron Weimer S, MD

## 2015-01-14 NOTE — Progress Notes (Signed)
  Medical screening examination/treatment/procedure(s) were performed by non-physician practitioner and as supervising physician I was immediately available for consultation/collaboration.     

## 2015-01-14 NOTE — Progress Notes (Signed)
   01/14/2015 at 5:13 PM  Myles Lippsashaad D Durrett / DOB: 02/19/1996 / MRN: [id number]  The patient has INFLUENZA; CHEST PAIN; Urinary incontinence; and PTSD (post-traumatic stress disorder) on his problem list.  SUBJECTIVE  Chief Complaint  Patient presents with  . Follow-up    Workers Comp, back injury, x 2 weeks     Silviano D Manson PasseyBrown is a 19 y.o. male here for evaluation for an injury that occurred at work in which he injured his .  He  has no past medical history on file.    Medications reviewed and updated by myself where necessary, and exist elsewhere in the encounter.   Mr. Manson PasseyBrown has No Known Allergies. He  reports that he has never smoked. He has never used smokeless tobacco. He reports that he does not drink alcohol or use illicit drugs. He  has no sexual activity history on file. The patient  has no past surgical history on file.  His family history is not on file.  ROS  OBJECTIVE  His  height is 5\' 10"  (1.778 m) and weight is 145 lb (65.772 kg). His oral temperature is 98.6 F (37 C). His blood pressure is 118/76 and his pulse is 77. His respiration is 16 and oxygen saturation is 99%.  The patient's body mass index is 20.81 kg/(m^2).  Physical Exam  No results found for this or any previous visit (from the past 24 hour(s)).  ASSESSMENT & PLAN  There are no diagnoses linked to this encounter.  The patient was advised to call or come back to clinic if he does not see an improvement in symptoms, or worsens with the above plan.   Deliah BostonMichael Suella Cogar, MHS, PA-C [facility] 01/14/2015 5:13 PM

## 2015-01-24 NOTE — Progress Notes (Signed)
History and physical examinations reviewed in detail with PA English.  Xrays reviewed during visit. Agree with assessment and plan. Caitland Porchia Paulita FujitaMartin Arieal Cuoco, M.D. [facility]  [place] [address] [place], [place]  [id number] [phone] phone [phone] fax

## 2015-03-05 ENCOUNTER — Telehealth: Payer: Self-pay

## 2015-03-05 NOTE — Telephone Encounter (Signed)
Waiting on payment of $16.50 for [facility] pages [facility].

## 2015-03-16 NOTE — Telephone Encounter (Signed)
Called [facility] they stated they no longer needed the records for this patient. So I cancelled the request and the invoice.

## 2015-12-10 ENCOUNTER — Emergency Department (HOSPITAL_COMMUNITY)
Admission: EM | Admit: 2015-12-10 | Discharge: 2015-12-10 | Disposition: A | Discharge status: Home / Self Care | Payer: Federal, State, Local not specified - PPO | Attending: Emergency Medicine | Admitting: Emergency Medicine

## 2015-12-10 ENCOUNTER — Encounter (HOSPITAL_COMMUNITY): Payer: Self-pay

## 2015-12-10 DIAGNOSIS — K219 Gastro-esophageal reflux disease without esophagitis: Secondary | ICD-10-CM | POA: Diagnosis not present

## 2015-12-10 DIAGNOSIS — R1013 Epigastric pain: Secondary | ICD-10-CM | POA: Diagnosis present

## 2015-12-10 LAB — COMPREHENSIVE METABOLIC PANEL
ALT: 19 U/L (ref 17–63)
ANION GAP: 10 (ref 5–15)
AST: 26 U/L (ref 15–41)
Albumin: 4.2 g/dL (ref 3.5–5.0)
Alkaline Phosphatase: 57 U/L (ref 38–126)
BILIRUBIN TOTAL: 1.4 mg/dL — AB (ref 0.3–1.2)
BUN: 7 mg/dL (ref 6–20)
CO2: 25 mmol/L (ref 22–32)
Calcium: 9.5 mg/dL (ref 8.9–10.3)
Chloride: 103 mmol/L (ref 101–111)
Creatinine, Ser: 1.06 mg/dL (ref 0.61–1.24)
GFR calc Af Amer: >60 mL/min (ref >60)
GFR calc non Af Amer: >60 mL/min (ref >60)
GLUCOSE: 98 mg/dL (ref 65–99)
POTASSIUM: 4.1 mmol/L (ref 3.5–5.1)
Sodium: 138 mmol/L (ref 135–145)
TOTAL PROTEIN: 7.3 g/dL (ref 6.5–8.1)

## 2015-12-10 LAB — CBC
HEMATOCRIT: 43.5 % (ref 39.0–52.0)
HEMOGLOBIN: 13.9 g/dL (ref 13.0–17.0)
MCH: 26.4 pg (ref 26.0–34.0)
MCHC: 32 g/dL (ref 30.0–36.0)
MCV: 82.7 fL (ref 78.0–100.0)
Platelets: 187 10*3/uL (ref 150–400)
RBC: 5.26 MIL/uL (ref 4.22–5.81)
RDW: 12.3 % (ref 11.5–15.5)
WBC: 4.2 10*3/uL (ref 4.0–10.5)

## 2015-12-10 LAB — URINALYSIS, ROUTINE W REFLEX MICROSCOPIC
Glucose, UA: NEGATIVE mg/dL
Hgb urine dipstick: NEGATIVE
Ketones, ur: NEGATIVE mg/dL
NITRITE: NEGATIVE
PH: 6 (ref 5.0–8.0)
Protein, ur: NEGATIVE mg/dL
SPECIFIC GRAVITY, URINE: 1.031 — AB (ref 1.005–1.030)

## 2015-12-10 LAB — URINE MICROSCOPIC-ADD ON: RBC / HPF: NONE SEEN RBC/hpf (ref 0–5)

## 2015-12-10 LAB — LIPASE, BLOOD: Lipase: 23 U/L (ref 11–51)

## 2015-12-10 MED ORDER — SUCRALFATE 1 GM/10ML PO SUSP: 1.0000 g | Freq: Three times a day (TID) | ORAL | 0 refills | Status: DC

## 2015-12-10 MED ORDER — PANTOPRAZOLE SODIUM 20 MG PO TBEC: 40.0000 mg | DELAYED_RELEASE_TABLET | Freq: Every day | ORAL | 0 refills | Status: DC

## 2015-12-10 MED ORDER — FAMOTIDINE 20 MG PO TABS
20.0000 mg | ORAL_TABLET | Freq: Once | ORAL | Status: AC
  Administered 2015-12-10: 20 mg via ORAL
  Filled 2015-12-10: qty 1

## 2015-12-10 MED ORDER — GI COCKTAIL ~~LOC~~
30.0000 mL | Freq: Once | ORAL | Status: AC
  Administered 2015-12-10: 30 mL via ORAL
  Filled 2015-12-10: qty 30

## 2015-12-10 NOTE — ED Provider Notes (Signed)
[facility]-EMERGENCY DEPT Provider Note   CSN: [id number] Arrival date & time: 12/10/15  0910     History   Chief Complaint Chief Complaint  Patient presents with  . Abdominal Pain    HPI  Blood pressure 109/71, pulse (!) 58, temperature 97.9 F (36.6 C), temperature source Oral, resp. rate 17, height 5\' 9"  (1.753 m), weight 61.2 kg, SpO2 100 %.  Erik Yang is a 20 y.o. male complaining of epigastric abdominal pain described as burning, waxing and waning, timing not associated with meals, palpation. He has nausea with no vomiting, change in bowel or bladder habits, fever, chills. No similar prior episodes.  HPI  History reviewed. No pertinent past medical history.  Patient Active Problem List   Diagnosis Date Noted  . Urinary incontinence 07/14/2013  . PTSD (post-traumatic stress disorder) 07/14/2013  . INFLUENZA 01/13/2008  . CHEST PAIN 04/25/2007    History reviewed. No pertinent surgical history.     Home Medications    Prior to Admission medications   Medication Sig Start Date End Date Taking? Authorizing Provider  pantoprazole (PROTONIX) 20 MG tablet Take 2 tablets (40 mg total) by mouth daily. 12/10/15   Aryan Sparks, PA-C  sucralfate (CARAFATE) 1 GM/10ML suspension Take 10 mLs (1 g total) by mouth 4 (four) times daily -  with meals and at bedtime. 12/10/15   Joni ReiningNicole Tiffanyann Deroo, PA-C    Family History No family history on file.  Social History Social History  Substance Use Topics  . Smoking status: Never Smoker  . Smokeless tobacco: Never Used  . Alcohol use No     Allergies   Review of patient's allergies indicates no known allergies.   Review of Systems Review of Systems  10 systems reviewed and found to be negative, except as noted in the HPI.   Physical Exam Updated Vital Signs BP 111/81 (BP Location: Right Arm)   Pulse (!) 52   Temp 97.5 F (36.4 C) (Oral)   Resp 18   Ht 5\' 9"  (1.753 m)   Wt 61.2 kg   SpO2 100%   BMI 19.94 kg/m    Physical Exam  Constitutional: He is oriented to person, place, and time. He appears well-developed and well-nourished. No distress.  HENT:  Head: Normocephalic and atraumatic.  Mouth/Throat: Oropharynx is clear and moist.  Eyes: Conjunctivae and EOM are normal. Pupils are equal, round, and reactive to light.  Neck: Normal range of motion.  Cardiovascular: Normal rate, regular rhythm and intact distal pulses.   Pulmonary/Chest: Effort normal and breath sounds normal. No respiratory distress. He has no wheezes. He has no rales. He exhibits no tenderness.  Abdominal: Soft. He exhibits no distension and no mass. There is no tenderness. There is no rebound and no guarding. No hernia.  Active bowel sounds, no tenderness to deep palpation of any quadrant, no guarding or rebound.  Musculoskeletal: Normal range of motion.  Neurological: He is alert and oriented to person, place, and time.  Skin: Capillary refill takes less than 2 seconds. He is not diaphoretic.  Psychiatric: He has a normal mood and affect.  Nursing note and vitals reviewed.    ED Treatments / Results  Labs (all labs ordered are listed, but only abnormal results are displayed) Labs Reviewed  COMPREHENSIVE METABOLIC PANEL - Abnormal; Notable for the following:       Result Value   Total Bilirubin 1.4 (*)    All other components within normal limits  URINALYSIS, ROUTINE W REFLEX MICROSCOPIC (NOT  AT [facility]) - Abnormal; Notable for the following:    Color, Urine AMBER (*)    Specific Gravity, Urine 1.031 (*)    Bilirubin Urine SMALL (*)    Leukocytes, UA TRACE (*)    All other components within normal limits  URINE MICROSCOPIC-ADD ON - Abnormal; Notable for the following:    Squamous Epithelial / LPF 0-5 (*)    Bacteria, UA RARE (*)    All other components within normal limits  LIPASE, BLOOD  CBC    EKG  EKG Interpretation None       Radiology No results found.  Procedures Procedures (including critical care  time)  Medications Ordered in ED Medications  famotidine (PEPCID) tablet 20 mg (20 mg Oral Given 12/10/15 1055)  gi cocktail (Maalox,Lidocaine,Donnatal) (30 mLs Oral Given 12/10/15 1055)     Initial Impression / Assessment and Plan / ED Course  I have reviewed the triage vital signs and the nursing notes.  Pertinent labs & imaging results that were available during my care of the patient were reviewed by me and considered in my medical decision making (see chart for details).  Clinical Course    Vitals:   12/10/15 0915 12/10/15 0920 12/10/15 1141  BP: 109/71  111/81  Pulse: (!) 58  (!) 52  Resp: 17  18  Temp: 97.9 F (36.6 C)  97.5 F (36.4 C)  TempSrc: Oral  Oral  SpO2: 100%  100%  Weight:  61.2 kg   Height:  5\' 9"  (1.753 m)     Medications  famotidine (PEPCID) tablet 20 mg (20 mg Oral Given 12/10/15 1055)  gi cocktail (Maalox,Lidocaine,Donnatal) (30 mLs Oral Given 12/10/15 1055)    Erik Yang is 20 y.o. male presenting with Burning epigastric abdominal pain, not tied to eating over the course of several days. Abdominal exam is benign, patient is afebrile and overall well appearing. No anorexia, I doubt this is appendicitis. Patient is counseled on the possibility of it being early appendicitis and extensive discussion of return precautions. Blood work is reassuring, patient is improved with Pepcid and GI cocktail, think this is likely GERD/goals were disease. Patient is given referral to GI and advised to follow with primary care. Advised him to stay away from NSAIDs.  Evaluation does not show pathology that would require ongoing emergent intervention or inpatient treatment. Pt is hemodynamically stable and mentating appropriately. Discussed findings and plan with patient/guardian, who agrees with care plan. All questions answered. Return precautions discussed and outpatient follow up given.      Final Clinical Impressions(s) / ED Diagnoses   Final diagnoses:    Gastroesophageal reflux disease, esophagitis presence not specified    New Prescriptions Discharge Medication List as of 12/10/2015 11:37 AM    START taking these medications   Details  pantoprazole (PROTONIX) 20 MG tablet Take 2 tablets (40 mg total) by mouth daily., Starting Fri 12/10/2015, Print    sucralfate (CARAFATE) 1 GM/10ML suspension Take 10 mLs (1 g total) by mouth 4 (four) times daily -  with meals and at bedtime., Starting Fri 12/10/2015, [employer], PA-C 12/10/15 1503    Bethann Berkshire, MD 12/10/15 1714

## 2015-12-10 NOTE — ED Triage Notes (Signed)
Per pt, Pt is coming from home with a burning upper abdominal pain that started three days ago. Pt tried OTC with no relief. Pt reports nausea with no vomiting. Pt reports intermittent increase of pain.

## 2015-12-10 NOTE — Discharge Instructions (Signed)
Do not take any NSAIDs (motrin, ibuprofen, advil, aleve, excedrin, aspirin, naproxen, goody's powder,  etc.) because they can further irritate your stomach.  ° °Please follow with your primary care doctor in the next 2 days for a check-up. They must obtain records for further management.  ° °Do not hesitate to return to the Emergency Department for any new, worsening or concerning symptoms.  ° ° °

## 2016-01-01 ENCOUNTER — Emergency Department (HOSPITAL_COMMUNITY)
Admission: EM | Admit: 2016-01-01 | Discharge: 2016-01-02 | Disposition: A | Discharge status: Home / Self Care | Payer: Federal, State, Local not specified - PPO | Attending: Emergency Medicine | Admitting: Emergency Medicine

## 2016-01-01 DIAGNOSIS — R51 Headache: Secondary | ICD-10-CM | POA: Diagnosis not present

## 2016-01-01 DIAGNOSIS — R519 Headache, unspecified: Secondary | ICD-10-CM

## 2016-01-01 MED ORDER — SODIUM CHLORIDE 0.9 % IV BOLUS (SEPSIS)
1000.0000 mL | Freq: Once | INTRAVENOUS | Status: AC
  Administered 2016-01-01: 1000 mL via INTRAVENOUS

## 2016-01-01 MED ORDER — IBUPROFEN 400 MG PO TABS
400.0000 mg | ORAL_TABLET | Freq: Once | ORAL | Status: AC
  Administered 2016-01-01: 400 mg via ORAL

## 2016-01-01 MED ORDER — BUTALBITAL-APAP-CAFFEINE 50-325-40 MG PO TABS: 1.0000 | ORAL_TABLET | Freq: Three times a day (TID) | ORAL | 0 refills | Status: AC | PRN

## 2016-01-01 MED ORDER — METOCLOPRAMIDE HCL 5 MG/ML IJ SOLN
10.0000 mg | INTRAMUSCULAR | Status: AC
  Administered 2016-01-01: 10 mg via INTRAVENOUS
  Filled 2016-01-01: qty 2

## 2016-01-01 MED ORDER — DIPHENHYDRAMINE HCL 50 MG/ML IJ SOLN
12.5000 mg | Freq: Once | INTRAMUSCULAR | Status: DC
  Filled 2016-01-01: qty 1

## 2016-01-01 MED ORDER — IBUPROFEN 400 MG PO TABS
ORAL_TABLET | ORAL | Status: AC
  Filled 2016-01-01: qty 1

## 2016-01-01 MED ORDER — KETOROLAC TROMETHAMINE 30 MG/ML IJ SOLN
30.0000 mg | Freq: Once | INTRAMUSCULAR | Status: AC
  Administered 2016-01-01: 30 mg via INTRAVENOUS
  Filled 2016-01-01: qty 1

## 2016-01-01 NOTE — ED Triage Notes (Signed)
Pt arrives c/o migraine starting yesterday states worse today. States "feels like fire."  Reports pain is right sided, reports dizziness. No nausea/vomiting or blurred vision. Reports aleve not effective.

## 2016-01-01 NOTE — ED Provider Notes (Signed)
[facility]EMER[facility] DEPT Provider Note   CSN: [id number] Arrival date & time: 01/01/16  2011     History   Chief Complaint Chief Complaint  Patient presents with  . Headache    HPI Erik Yang is a 20 y.o. male.  21 year old male presents to the emergency department for evaluation of a right temporal headache. He states that his headache began yesterday and worsened this morning, waking him from sleep. He states that he tried Excedrin Migraine, but this provided no relief of his symptoms. In triage, patient states that his headache "feels like fire". Patient denies any modifying factors of his symptoms. He has had no fever, recent head injury, nausea, vomiting, blurry vision, photophobia, phonophobia, extremity numbness or paresthesias, or extremity weakness. No neck stiffness.   The history is provided by the patient. No language interpreter was used.  Headache      No past medical history on file.  Patient Active Problem List   Diagnosis Date Noted  . Urinary incontinence 07/14/2013  . PTSD (post-traumatic stress disorder) 07/14/2013  . INFLUENZA 01/13/2008  . CHEST PAIN 04/25/2007    No past surgical history on file.     Home Medications    Prior to Admission medications   Medication Sig Start Date End Date Taking? Authorizing Provider  butalbital-acetaminophen-caffeine (FIORICET, ESGIC) 50-325-40 MG tablet Take 1-2 tablets by mouth every 8 (eight) hours as needed for headache. 01/01/16 12/31/16  Antony Madura, PA-C  pantoprazole (PROTONIX) 20 MG tablet Take 2 tablets (40 mg total) by mouth daily. Patient not taking: Reported on 01/01/2016 12/10/15   Joni Reining Pisciotta, PA-C  sucralfate (CARAFATE) 1 GM/10ML suspension Take 10 mLs (1 g total) by mouth 4 (four) times daily -  with meals and at bedtime. Patient not taking: Reported on 01/01/2016 12/10/15   Wynetta Emery, PA-C    Family History No family history on file.  Social History Social History  Substance Use  Topics  . Smoking status: Never Smoker  . Smokeless tobacco: Never Used  . Alcohol use No     Allergies   Review of patient's allergies indicates no known allergies.   Review of Systems Review of Systems  Neurological: Positive for headaches.  Ten systems reviewed and are negative for acute change, except as noted in the HPI.     Physical Exam Updated Vital Signs BP 125/76   Pulse 61   Temp 98 F (36.7 C) (Oral)   Resp 16   Ht 5\' 10"  (1.778 m)   Wt 61.2 kg   SpO2 100%   BMI 19.37 kg/m   Physical Exam  Constitutional: He is oriented to person, place, and time. He appears well-developed and well-nourished. No distress.  Nontoxic and in no distress  HENT:  Head: Normocephalic and atraumatic.  Mouth/Throat: Oropharynx is clear and moist.  Symmetric rise of the uvula with phonation  Eyes: Conjunctivae and EOM are normal. Pupils are equal, round, and reactive to light. No scleral icterus.  Neck: Normal range of motion.  No nuchal rigidity or meningismus  Cardiovascular: Normal rate, regular rhythm and intact distal pulses.   Pulmonary/Chest: Effort normal. No respiratory distress. He has no wheezes.  Respirations even and unlabored  Musculoskeletal: Normal range of motion.  Neurological: He is alert and oriented to person, place, and time. No cranial nerve deficit. He exhibits normal muscle tone. Coordination normal.  GCS 15. Speech is goal oriented. No cranial nerve deficits appreciated; symmetric eyebrow raise, no facial drooping, tongue midline. Patient has equal  grip strength bilaterally with 5/5 strength against resistance in all major muscle groups bilaterally. Sensation to light touch intact. Patient moves extremity is without ataxia. Normal finger-nose-finger. Patient ambulatory with steady gait.  Skin: Skin is warm and dry. No rash noted. He is not diaphoretic. No erythema. No pallor.  Psychiatric: He has a normal mood and affect. His behavior is normal.  Nursing  note and vitals reviewed.    ED Treatments / Results  Labs (all labs ordered are listed, but only abnormal results are displayed) Labs Reviewed - No data to display  EKG  EKG Interpretation None       Radiology No results found.  Procedures Procedures (including critical care time)  Medications Ordered in ED Medications  ibuprofen (ADVIL,MOTRIN) 400 MG tablet (not administered)  diphenhydrAMINE (BENADRYL) injection 12.5 mg (0 mg Intravenous Hold 01/01/16 2259)  ibuprofen (ADVIL,MOTRIN) tablet 400 mg (400 mg Oral Given 01/01/16 2039)  ketorolac (TORADOL) 30 MG/ML injection 30 mg (30 mg Intravenous Given 01/01/16 2259)  metoCLOPramide (REGLAN) injection 10 mg (10 mg Intravenous Given 01/01/16 2258)  sodium chloride 0.9 % bolus 1,000 mL (1,000 mLs Intravenous New Bag/Given 01/01/16 2258)     Initial Impression / Assessment and Plan / ED Course  I have reviewed the triage vital signs and the nursing notes.  Pertinent labs & imaging results that were available during my care of the patient were reviewed by me and considered in my medical decision making (see chart for details).  Clinical Course    12:00 AM Patient reassessed. He reports improvement in his headache with migraine cocktail. On reassessment, he was initially found to be sleeping. Vitals have been stable. Neurologic exam nonfocal. Patient is afebrile and without nuchal rigidity or meningismus. Doubt meningitis. No history of head injury. Low suspicion for emergent causes pain. To discharge with supportive care. Return precautions discussed and provided. Patient discharged in stable condition with no unaddressed concerns.   Final Clinical Impressions(s) / ED Diagnoses   Final diagnoses:  Bad headache    New Prescriptions New Prescriptions   BUTALBITAL-ACETAMINOPHEN-CAFFEINE (FIORICET, ESGIC) 50-325-40 MG TABLET    Take 1-2 tablets by mouth every 8 (eight) hours as needed for headache.     Antony MaduraKelly Demaris Leavell,  PA-C 01/02/16 0001    Arby BarretteMarcy Pfeiffer, MD 01/02/16 Moses [email or web address]

## 2016-01-12 ENCOUNTER — Ambulatory Visit (INDEPENDENT_AMBULATORY_CARE_PROVIDER_SITE_OTHER): Payer: Federal, State, Local not specified - PPO | Admitting: Family Medicine

## 2016-01-12 ENCOUNTER — Encounter: Payer: Self-pay | Admitting: Family Medicine

## 2016-01-12 ENCOUNTER — Ambulatory Visit: Payer: Self-pay | Admitting: Family Medicine

## 2016-01-12 VITALS — BP 104/77 | HR 90 | Temp 98.6°F | Ht 70.0 in | Wt 141.0 lb

## 2016-01-12 DIAGNOSIS — G43909 Migraine, unspecified, not intractable, without status migrainosus: Secondary | ICD-10-CM | POA: Insufficient documentation

## 2016-01-12 DIAGNOSIS — G43119 Migraine with aura, intractable, without status migrainosus: Secondary | ICD-10-CM

## 2016-01-12 MED ORDER — SUMATRIPTAN SUCCINATE 100 MG PO TABS: 100.0000 mg | ORAL_TABLET | ORAL | 5 refills | Status: DC | PRN

## 2016-01-12 MED ORDER — HYDROCODONE-ACETAMINOPHEN 5-325 MG PO TABS: 1.0000 | ORAL_TABLET | Freq: Four times a day (QID) | ORAL | 0 refills | Status: DC | PRN

## 2016-01-12 MED ORDER — KETOROLAC TROMETHAMINE 60 MG/2ML IM SOLN
60.0000 mg | Freq: Once | INTRAMUSCULAR | Status: AC
  Administered 2016-01-12: 60 mg via INTRAMUSCULAR

## 2016-01-12 NOTE — Progress Notes (Signed)
   Subjective:    Patient ID: Erik Yang, male    DOB: 09/26/1995, 20 y.o.   MRN: [id number]  HPI Here with a recurrent headache. He was seen in the ER on 01-01-16 with a right temporal headache. He had no other neurologic deficits. He was  Given a migraine cocktail and some Fioricet tablets, and the HA resolved. Now he is here for a similar headache that started on 01-08-16 and has persisted ever since. This also began in the right temporal area and then generalized to the entire head. He has sensitivity to light and noise. He sees bright spots in his vision at times. No other neurologic deficits. No nausea or vomiting. He had someone drive him here. No family hx of headaches.    Review of Systems  Constitutional: Negative.   Eyes: Positive for photophobia and visual disturbance. Negative for pain.  Respiratory: Negative.   Cardiovascular: Negative.   Neurological: Positive for headaches. Negative for dizziness, tremors, seizures, syncope, facial asymmetry, speech difficulty, weakness, light-headedness and numbness.       Objective:   Physical Exam  Constitutional: He is oriented to person, place, and time. He appears well-developed and well-nourished.  In pain, photophobic   HENT:  Head: Normocephalic and atraumatic.  Eyes: Conjunctivae and EOM are normal. Pupils are equal, round, and reactive to light.  Neck: Neck supple. No thyromegaly present.  Cardiovascular: Normal rate, regular rhythm, normal heart sounds and intact distal pulses.   Pulmonary/Chest: Effort normal and breath sounds normal.  Lymphadenopathy:    He has no cervical adenopathy.  Neurological: He is alert and oriented to person, place, and time. No cranial nerve deficit. He exhibits normal muscle tone. Coordination normal.          Assessment & Plan:  Headaches, likely migraines. Treated with a shot of Toradol and some Norco to use prn. Written out of work from 01-08-16 through today. He will use Sumatriptan  the next time he starts to have a headache.  Nelwyn SalisburyFRY,STEPHEN A, MD

## 2016-01-12 NOTE — Progress Notes (Signed)
Pre visit review using our clinic review tool, if applicable. No additional management support is needed unless otherwise documented below in the visit note. 

## 2016-02-08 ENCOUNTER — Ambulatory Visit: Payer: Self-pay | Admitting: Family Medicine

## 2016-12-14 ENCOUNTER — Encounter: Payer: Self-pay | Admitting: Family Medicine

## 2017-07-14 ENCOUNTER — Encounter (HOSPITAL_COMMUNITY): Payer: Self-pay

## 2017-07-14 ENCOUNTER — Emergency Department (HOSPITAL_COMMUNITY)
Admission: EM | Admit: 2017-07-14 | Discharge: 2017-07-14 | Disposition: A | Discharge status: Home / Self Care | Payer: Federal, State, Local not specified - PPO | Attending: Physician Assistant | Admitting: Physician Assistant

## 2017-07-14 DIAGNOSIS — R51 Headache: Secondary | ICD-10-CM | POA: Diagnosis not present

## 2017-07-14 DIAGNOSIS — Z79899 Other long term (current) drug therapy: Secondary | ICD-10-CM | POA: Insufficient documentation

## 2017-07-14 DIAGNOSIS — R519 Headache, unspecified: Secondary | ICD-10-CM

## 2017-07-14 MED ORDER — DIPHENHYDRAMINE HCL 25 MG PO CAPS
25.0000 mg | ORAL_CAPSULE | Freq: Once | ORAL | Status: AC
  Administered 2017-07-14: 25 mg via ORAL
  Filled 2017-07-14: qty 1

## 2017-07-14 MED ORDER — PROCHLORPERAZINE MALEATE 5 MG PO TABS
10.0000 mg | ORAL_TABLET | Freq: Once | ORAL | Status: AC
  Administered 2017-07-14: 10 mg via ORAL
  Filled 2017-07-14: qty 2

## 2017-07-14 MED ORDER — IBUPROFEN 800 MG PO TABS
800.0000 mg | ORAL_TABLET | Freq: Once | ORAL | Status: AC
  Administered 2017-07-14: 800 mg via ORAL
  Filled 2017-07-14: qty 1

## 2017-07-14 NOTE — ED Notes (Signed)
Pt discharged from ED; instructions provided; Pt encouraged to return to ED if symptoms worsen and to f/u with PCP; Pt verbalized understanding of all instructions 

## 2017-07-14 NOTE — ED Provider Notes (Signed)
[facility] EMERGENCY DEPARTMENT Provider Note   CSN: [id number] Arrival date & time: 07/14/17  2057     History   Chief Complaint Chief Complaint  Patient presents with  . Headache    HPI Erik Yang is a 22 y.o. male.  Erik Yang is a 22 y.o. Male with a history of migraines, PTSD, presents to the ED for evaluation of headache.  Patient reports headache is present across the forehead and bilateral temples.  He reports headache started when he woke up this morning and has been gradually getting worse throughout the day.  He reports this is consistent with his prior headaches, and is not different in any way or worse than usual.  Headache was not maximal in intensity at onset.  He took Aleve today which improved the headache somewhat but has not completely resolved it.  He denies any associated fevers, neck pain or stiffness.  No vision changes, he does report some sensitivity to light and sound.  No dizziness.  Denies any weakness numbness or tingling in his extremities.  No nausea or vomiting.  Denies abdominal pain, chest pain or shortness of breath.     History reviewed. No pertinent past medical history.  Patient Active Problem List   Diagnosis Date Noted  . Migraine 01/12/2016  . Urinary incontinence 07/14/2013  . PTSD (post-traumatic stress disorder) 07/14/2013  . INFLUENZA 01/13/2008  . CHEST PAIN 04/25/2007    History reviewed. No pertinent surgical history.      Home Medications    Prior to Admission medications   Medication Sig Start Date End Date Taking? Authorizing Provider  HYDROcodone-acetaminophen (NORCO) 5-325 MG tablet Take 1 tablet by mouth every 6 (six) hours as needed for moderate pain. 01/12/16   Nelwyn Salisbury, MD  pantoprazole (PROTONIX) 20 MG tablet Take 2 tablets (40 mg total) by mouth daily. 12/10/15   Pisciotta, Joni Reining, PA-C  SUMAtriptan (IMITREX) 100 MG tablet Take 1 tablet (100 mg total) by mouth as needed for  migraine. May repeat in 2 hours if headache persists or recurs. 01/12/16   Nelwyn Salisbury, MD    Family History No family history on file.  Social History Social History   Tobacco Use  . Smoking status: Never Smoker  . Smokeless tobacco: Never Used  Substance Use Topics  . Alcohol use: No    Alcohol/week: 0.0 oz  . Drug use: No     Allergies   Patient has no known allergies.   Review of Systems Review of Systems  Constitutional: Negative for chills and fever.  HENT: Negative for congestion, ear pain, rhinorrhea and sore throat.   Eyes: Positive for photophobia. Negative for visual disturbance.  Respiratory: Negative for cough and shortness of breath.   Cardiovascular: Negative for chest pain.  Gastrointestinal: Negative for abdominal pain, nausea and vomiting.  Genitourinary: Negative for dysuria and frequency.  Musculoskeletal: Negative for arthralgias, myalgias, neck pain and neck stiffness.  Skin: Negative for color change and rash.  Neurological: Positive for headaches. Negative for dizziness, facial asymmetry, speech difficulty, weakness, light-headedness and numbness.     Physical Exam Updated Vital Signs BP 123/85 (BP Location: Right Arm)   Pulse (!) 53   Temp 97.7 F (36.5 C) (Oral)   Resp 15   Ht 5\' 10"  (1.778 m)   Wt 64.9 kg (143 lb)   SpO2 100%   BMI 20.52 kg/m   Physical Exam  Constitutional: He appears well-developed and well-nourished. No distress.  HENT:  Head: Normocephalic and atraumatic.  Mouth/Throat: Oropharynx is clear and moist.  Eyes: Pupils are equal, round, and reactive to light. EOM are normal. Right eye exhibits no discharge. Left eye exhibits no discharge.  No nystagmus  Neck: Normal range of motion. Neck supple.  Cardiovascular: Normal rate, regular rhythm, normal heart sounds and intact distal pulses.  Pulmonary/Chest: Effort normal and breath sounds normal. No respiratory distress.  Respirations equal and unlabored, patient  able to speak in full sentences, lungs clear to auscultation bilaterally  Neurological: He is alert. Coordination normal. GCS eye subscore is 4. GCS verbal subscore is 5. GCS motor subscore is 6.  Speech is clear, able to follow commands CN III-XII intact Normal strength in upper and lower extremities bilaterally including dorsiflexion and plantar flexion, strong and equal grip strength Sensation normal to light and sharp touch Moves extremities without ataxia, coordination intact Normal finger to nose and rapid alternating movements No pronator drift  Skin: Skin is warm and dry. Capillary refill takes less than 2 seconds. He is not diaphoretic.  Psychiatric: He has a normal mood and affect. His behavior is normal.  Nursing note and vitals reviewed.    ED Treatments / Results  Labs (all labs ordered are listed, but only abnormal results are displayed) Labs Reviewed - No data to display  EKG None  Radiology No results found.  Procedures Procedures (including critical care time)  Medications Ordered in ED Medications  ibuprofen (ADVIL,MOTRIN) tablet 800 mg (800 mg Oral Given 07/14/17 2252)  prochlorperazine (COMPAZINE) tablet 10 mg (10 mg Oral Given 07/14/17 2252)  diphenhydrAMINE (BENADRYL) capsule 25 mg (25 mg Oral Given 07/14/17 2252)     Initial Impression / Assessment and Plan / ED Course  I have reviewed the triage vital signs and the nursing notes.  Pertinent labs & imaging results that were available during my care of the patient were reviewed by me and considered in my medical decision making (see chart for details).  Pt HA treated and improved while in ED.  Presentation is like pts typical HA and [facility] concerning [facility], ICH, Meningitis, or temporal arteritis. Pt is afebrile with no focal neuro deficits, nuchal rigidity, or change in vision. Pt is to follow up with PCP to discuss prophylactic medication. Pt verbalizes understanding and is agreeable with plan to dc.    Final Clinical Impressions(s) / ED Diagnoses   Final diagnoses:  Bad headache    ED Discharge Orders    None       Legrand RamsFord,  N, PA-C 07/14/17 2320    Abelino DerrickMackuen, Courteney Lyn, MD 07/14/17 [phone]

## 2017-07-14 NOTE — ED Triage Notes (Signed)
Pt states that he has a headache that has been going on all day unrelieved by OTC medication. Denies n/v

## 2017-07-14 NOTE — Discharge Instructions (Signed)
Your evaluation today is overall reassuring, you may continue to use Aleve as needed for headaches.  Please make sure you are drinking plenty of fluids.  Please follow-up with your primary care doctor in the next week.  If any of the below scenarios develop please return to the emergency department.  Get help right away if: Your headache becomes really bad. You keep throwing up. You have a stiff neck. You have trouble seeing. You have trouble speaking. You have pain in the eye or ear. Your muscles are weak or you lose muscle control. You lose your balance or have trouble walking. You feel like you will pass out (faint) or you pass out. You have confusion.

## 2017-12-29 ENCOUNTER — Emergency Department (HOSPITAL_COMMUNITY): Payer: Federal, State, Local not specified - PPO

## 2017-12-29 ENCOUNTER — Encounter (HOSPITAL_COMMUNITY): Payer: Self-pay | Admitting: *Deleted

## 2017-12-29 ENCOUNTER — Other Ambulatory Visit: Payer: Self-pay

## 2017-12-29 ENCOUNTER — Emergency Department (HOSPITAL_COMMUNITY)
Admission: EM | Admit: 2017-12-29 | Discharge: 2017-12-29 | Disposition: A | Discharge status: Home / Self Care | Payer: Federal, State, Local not specified - PPO | Attending: Emergency Medicine | Admitting: Emergency Medicine

## 2017-12-29 DIAGNOSIS — K529 Noninfective gastroenteritis and colitis, unspecified: Secondary | ICD-10-CM

## 2017-12-29 DIAGNOSIS — R197 Diarrhea, unspecified: Secondary | ICD-10-CM | POA: Diagnosis not present

## 2017-12-29 DIAGNOSIS — R1013 Epigastric pain: Secondary | ICD-10-CM | POA: Diagnosis not present

## 2017-12-29 LAB — CBC
HCT: 48.7 % (ref 39.0–52.0)
Hemoglobin: 15.4 g/dL (ref 13.0–17.0)
MCH: 26.4 pg (ref 26.0–34.0)
MCHC: 31.6 g/dL (ref 30.0–36.0)
MCV: 83.5 fL (ref 78.0–100.0)
PLATELETS: 210 10*3/uL (ref 150–400)
RBC: 5.83 MIL/uL — AB (ref 4.22–5.81)
RDW: 11.8 % (ref 11.5–15.5)
WBC: 7.3 10*3/uL (ref 4.0–10.5)

## 2017-12-29 LAB — URINALYSIS, ROUTINE W REFLEX MICROSCOPIC
BILIRUBIN URINE: NEGATIVE
Glucose, UA: NEGATIVE mg/dL
Hgb urine dipstick: NEGATIVE
KETONES UR: 5 mg/dL — AB
Leukocytes, UA: NEGATIVE
NITRITE: NEGATIVE
PROTEIN: NEGATIVE mg/dL
Specific Gravity, Urine: 1.031 — ABNORMAL HIGH (ref 1.005–1.030)
pH: 5 (ref 5.0–8.0)

## 2017-12-29 LAB — COMPREHENSIVE METABOLIC PANEL
ALBUMIN: 4.7 g/dL (ref 3.5–5.0)
ALT: 12 U/L (ref 0–44)
AST: 20 U/L (ref 15–41)
Alkaline Phosphatase: 58 U/L (ref 38–126)
Anion gap: 8 (ref 5–15)
BUN: 12 mg/dL (ref 6–20)
CALCIUM: 9.7 mg/dL (ref 8.9–10.3)
CO2: 28 mmol/L (ref 22–32)
CREATININE: 1.21 mg/dL (ref 0.61–1.24)
Chloride: 99 mmol/L (ref 98–111)
GFR calc Af Amer: >60 mL/min (ref >60)
GLUCOSE: 108 mg/dL — AB (ref 70–99)
Potassium: 4.1 mmol/L (ref 3.5–5.1)
SODIUM: 135 mmol/L (ref 135–145)
TOTAL PROTEIN: 7.9 g/dL (ref 6.5–8.1)
Total Bilirubin: 1.5 mg/dL — ABNORMAL HIGH (ref 0.3–1.2)

## 2017-12-29 LAB — LIPASE, BLOOD: LIPASE: 24 U/L (ref 11–51)

## 2017-12-29 MED ORDER — PROMETHAZINE HCL 25 MG PO TABS: 25.0000 mg | ORAL_TABLET | Freq: Four times a day (QID) | ORAL | 0 refills | Status: DC | PRN

## 2017-12-29 MED ORDER — IOHEXOL 300 MG/ML  SOLN: 100.0000 mL | Freq: Once | INTRAMUSCULAR | Status: DC | PRN

## 2017-12-29 MED ORDER — IOHEXOL 300 MG/ML  SOLN
100.0000 mL | Freq: Once | INTRAMUSCULAR | Status: AC | PRN
  Administered 2017-12-29: 100 mL via INTRAVENOUS

## 2017-12-29 MED ORDER — LOPERAMIDE HCL 2 MG PO CAPS: 2.0000 mg | ORAL_CAPSULE | Freq: Four times a day (QID) | ORAL | 0 refills | Status: DC | PRN

## 2017-12-29 MED ORDER — SODIUM CHLORIDE 0.9 % IV BOLUS
1000.0000 mL | Freq: Once | INTRAVENOUS | Status: AC
Start: 2017-12-29 — End: 2017-12-29
  Administered 2017-12-29: 1000 mL via INTRAVENOUS

## 2017-12-29 NOTE — ED Triage Notes (Signed)
Pt c/o abd pain for the past three days. Has had diarrhea and nausea.

## 2017-12-29 NOTE — ED Notes (Signed)
Refused IV access and fluids

## 2017-12-29 NOTE — Discharge Instructions (Addendum)
Return here as needed.  Your CT scan and laboratory testing did not show any significant abnormality.  Follow-up with your primary doctor.  Slowly increase her fluid intake.

## 2017-12-29 NOTE — ED Provider Notes (Signed)
[facility] EMERGENCY DEPARTMENT Provider Note   CSN: [id number] Arrival date & time: 12/29/17  1920     History   Chief Complaint Chief Complaint  Patient presents with  . Abdominal Pain    HPI Erik Yang is a 22 y.o. male.  HPI Patient presents to the emergency department with nausea and diarrhea with abdominal discomfort over the last 3 days.  The patient states he did take Pepto with some relief of his symptoms.  The patient states that nothing seems to make his condition better or worse.  The patient states that there is no blood in his diarrhea.  Patient states that he has not had this problem in the past.  The patient denies chest pain, shortness of breath, headache,blurred vision, neck pain, fever, cough, weakness, numbness, dizziness, anorexia, edema, vomiting, rash, back pain, dysuria, hematemesis, bloody stool, near syncope, or syncope. History reviewed. No pertinent past medical history.  Patient Active Problem List   Diagnosis Date Noted  . Migraine 01/12/2016  . Urinary incontinence 07/14/2013  . PTSD (post-traumatic stress disorder) 07/14/2013  . INFLUENZA 01/13/2008  . CHEST PAIN 04/25/2007    History reviewed. No pertinent surgical history.      Home Medications    Prior to Admission medications   Medication Sig Start Date End Date Taking? Authorizing Provider  HYDROcodone-acetaminophen (NORCO) 5-325 MG tablet Take 1 tablet by mouth every 6 (six) hours as needed for moderate pain. Patient not taking: Reported on 07/14/2017 01/12/16   Nelwyn Salisbury, MD  pantoprazole (PROTONIX) 20 MG tablet Take 2 tablets (40 mg total) by mouth daily. Patient not taking: Reported on 07/14/2017 12/10/15   Pisciotta, Joni Reining, PA-C  SUMAtriptan (IMITREX) 100 MG tablet Take 1 tablet (100 mg total) by mouth as needed for migraine. May repeat in 2 hours if headache persists or recurs. Patient not taking: Reported on 07/14/2017 01/12/16   Nelwyn Salisbury, MD     Family History No family history on file.  Social History Social History   Tobacco Use  . Smoking status: Never Smoker  . Smokeless tobacco: Never Used  Substance Use Topics  . Alcohol use: No    Alcohol/week: 0.0 standard drinks  . Drug use: No     Allergies   Patient has no known allergies.   Review of Systems Review of Systems All other systems negative except as documented in the HPI. All pertinent positives and negatives as reviewed in the HPI.  Physical Exam Updated Vital Signs BP 113/85 (BP Location: Right Arm)   Pulse 78   Temp (!) 97.5 F (36.4 C) (Oral)   Resp 16   SpO2 99%   Physical Exam  Constitutional: He is oriented to person, place, and time. He appears well-developed and well-nourished. No distress.  HENT:  Head: Normocephalic and atraumatic.  Mouth/Throat: Oropharynx is clear and moist.  Eyes: Pupils are equal, round, and reactive to light.  Neck: Normal range of motion. Neck supple.  Cardiovascular: Normal rate, regular rhythm and normal heart sounds. Exam reveals no gallop and no friction rub.  No murmur heard. Pulmonary/Chest: Effort normal and breath sounds normal. No respiratory distress. He has no wheezes.  Abdominal: Soft. Bowel sounds are normal. He exhibits no distension. There is no hepatosplenomegaly. There is tenderness in the epigastric area and periumbilical area. There is no rigidity, no rebound, no guarding and negative Murphy's sign. No hernia.  Neurological: He is alert and oriented to person, place, and time. He exhibits  normal muscle tone. Coordination normal.  Skin: Skin is warm and dry. Capillary refill takes less than 2 seconds. No rash noted. No erythema.  Psychiatric: He has a normal mood and affect. His behavior is normal.  Nursing note and vitals reviewed.    ED Treatments / Results  Labs (all labs ordered are listed, but only abnormal results are displayed) Labs Reviewed  COMPREHENSIVE METABOLIC PANEL -  Abnormal; Notable for the following components:      Result Value   Glucose, Bld 108 (*)    Total Bilirubin 1.5 (*)    All other components within normal limits  CBC - Abnormal; Notable for the following components:   RBC 5.83 (*)    All other components within normal limits  URINALYSIS, ROUTINE W REFLEX MICROSCOPIC - Abnormal; Notable for the following components:   Specific Gravity, Urine 1.031 (*)    Ketones, ur 5 (*)    All other components within normal limits  LIPASE, BLOOD    EKG None  Radiology No results found.  Procedures Procedures (including critical care time)  Medications Ordered in ED Medications  sodium chloride 0.9 % bolus 1,000 mL (1,000 mLs Intravenous Not Given 12/29/17 2044)     Initial Impression / Assessment and Plan / ED Course  I have reviewed the triage vital signs and the nursing notes.  Pertinent labs & imaging results that were available during my care of the patient were reviewed by me and considered in my medical decision making (see chart for details).    Patient has no abnormal CT scan findings that his laboratory testing did not yield any significant abnormalities.  Patient will be discharged with symptomatic control and advised to return here as needed.  The patient agrees the plan and all questions were answered.  Patient most likely has a gastroenteritis type illness and will be advised to follow-up with his primary doctor.  Patient's laboratory testing was reviewed along with his CT scan.  Final Clinical Impressions(s) / ED Diagnoses   Final diagnoses:  None    ED Discharge Orders    None       Charlestine Night, PA-C 12/29/17 2244    Sabas Sous, MD 12/29/17 [phone]

## 2018-04-12 ENCOUNTER — Emergency Department (HOSPITAL_COMMUNITY)
Admission: EM | Admit: 2018-04-12 | Discharge: 2018-04-13 | Disposition: A | Discharge status: Home / Self Care | Payer: Federal, State, Local not specified - PPO | Attending: Emergency Medicine | Admitting: Emergency Medicine

## 2018-04-12 DIAGNOSIS — G43009 Migraine without aura, not intractable, without status migrainosus: Secondary | ICD-10-CM | POA: Insufficient documentation

## 2018-04-12 DIAGNOSIS — H9311 Tinnitus, right ear: Secondary | ICD-10-CM | POA: Insufficient documentation

## 2018-04-12 DIAGNOSIS — R51 Headache: Secondary | ICD-10-CM | POA: Insufficient documentation

## 2018-04-13 ENCOUNTER — Encounter (HOSPITAL_COMMUNITY): Payer: Self-pay | Admitting: Emergency Medicine

## 2018-04-13 MED ORDER — IBUPROFEN 800 MG PO TABS
800.0000 mg | ORAL_TABLET | Freq: Once | ORAL | Status: AC
Start: 2018-04-13 — End: 2018-04-13
  Administered 2018-04-13: 800 mg via ORAL
  Filled 2018-04-13: qty 1

## 2018-04-13 NOTE — ED Provider Notes (Signed)
TIME SEEN: 4:53 AM  CHIEF COMPLAINT: Right-sided headache, tinnitus  HPI: Patient is a 23 year old male with history of headaches who presents the emergency department right-sided throbbing headache for the past several days.  Gradual in onset.  No numbness, tingling or focal weakness.  No vision changes.  Did not try any medications prior to arrival.  Has had some tinnitus.  Has been on amoxicillin for possible dental infection but no other medications.  Has not been taking aspirin recently.  No head injury.  Not on blood thinners.  No ear pain or hearing loss.  ROS: See HPI Constitutional: no fever  Eyes: no drainage  ENT: no runny nose   Cardiovascular:  no chest pain  Resp: no SOB  GI: no vomiting GU: no dysuria Integumentary: no rash  Allergy: no hives  Musculoskeletal: no leg swelling  Neurological: no slurred speech ROS otherwise negative  PAST MEDICAL HISTORY/PAST SURGICAL HISTORY:  History reviewed. No pertinent past medical history.  MEDICATIONS:  Prior to Admission medications   Medication Sig Start Date End Date Taking? Authorizing Provider  HYDROcodone-acetaminophen (NORCO) 5-325 MG tablet Take 1 tablet by mouth every 6 (six) hours as needed for moderate pain. Patient not taking: Reported on 07/14/2017 01/12/16   Nelwyn Salisbury, MD  loperamide (IMODIUM) 2 MG capsule Take 1 capsule (2 mg total) by mouth 4 (four) times daily as needed for diarrhea or loose stools. 12/29/17   Lawyer, Cristal Deer, PA-C  pantoprazole (PROTONIX) 20 MG tablet Take 2 tablets (40 mg total) by mouth daily. Patient not taking: Reported on 07/14/2017 12/10/15   Pisciotta, Joni Reining, PA-C  promethazine (PHENERGAN) 25 MG tablet Take 1 tablet (25 mg total) by mouth every 6 (six) hours as needed for nausea or vomiting. 12/29/17   Lawyer, Cristal Deer, PA-C  SUMAtriptan (IMITREX) 100 MG tablet Take 1 tablet (100 mg total) by mouth as needed for migraine. May repeat in 2 hours if headache persists or  recurs. Patient not taking: Reported on 07/14/2017 01/12/16   Nelwyn Salisbury, MD    ALLERGIES:  No Known Allergies  SOCIAL HISTORY:  Social History   Tobacco Use  . Smoking status: Never Smoker  . Smokeless tobacco: Never Used  Substance Use Topics  . Alcohol use: No    Alcohol/week: 0.0 standard drinks    FAMILY HISTORY: No family history on file.  EXAM: BP 107/71   Pulse 61   Temp 98 F (36.7 C) (Oral)   Resp 17   Ht 5\' 10"  (1.778 m)   Wt 61.2 kg   SpO2 100%   BMI 19.37 kg/m  CONSTITUTIONAL: Alert and oriented and responds appropriately to questions. Well-appearing; well-nourished HEAD: Normocephalic EYES: Conjunctivae clear, pupils appear equal, EOMI ENT: normal nose; moist mucous membranes; No pharyngeal erythema or petechiae, no tonsillar hypertrophy or exudate, no uvular deviation, no unilateral swelling, no trismus or drooling, no muffled voice, normal phonation, no stridor, no dental caries present, no drainable dental abscess noted, no Ludwig's angina, tongue sits flat in the bottom of the mouth, no angioedema, no facial erythema or warmth, no facial swelling; no pain with movement of the neck. TMs are clear bilaterally without erythema, purulence, bulging, perforation, effusion.  No cerumen impaction or sign of foreign body in the external auditory canal. No inflammation, erythema or drainage from the external auditory canal. No signs of mastoiditis. No pain with manipulation of the pinna bilaterally. NECK: Supple, no meningismus, no nuchal rigidity, no LAD  CARD: RRR; S1 and S2 appreciated; no  murmurs, no clicks, no rubs, no gallops RESP: Normal chest excursion without splinting or tachypnea; breath sounds clear and equal bilaterally; no wheezes, no rhonchi, no rales, no hypoxia or respiratory distress, speaking full sentences ABD/GI: Normal bowel sounds; non-distended; soft, non-tender, no rebound, no guarding, no peritoneal signs, no hepatosplenomegaly BACK:  The  back appears normal and is non-tender to palpation, there is no CVA tenderness EXT: Normal ROM in all joints; non-tender to palpation; no edema; normal capillary refill; no cyanosis, no calf tenderness or swelling    SKIN: Normal color for age and race; warm; no rash NEURO: Moves all extremities equally, sensation to light touch intact diffusely, cranial nerves II to XII intact, normal speech PSYCH: The patient's mood and manner are appropriate. Grooming and personal hygiene are appropriate.  MEDICAL DECISION MAKING: Patient here with headache.  Has history of similar headaches.  States headache has significantly improved without intervention.  No focal neurologic deficits.  Is having tinnitus.  No signs of mastoiditis, otitis media, otitis externa.  Has been on amoxicillin but no other medications that would contribute to tinnitus today.  No salicylate use.  Feels like the tinnitus is improving as his headache improved as well.  Recommended alternating Tylenol and Motrin.  This is his third visit to the ED for headaches.  Have recommended outpatient neurology follow-up.  Patient agrees with plan.  Discussed return precautions.  Doubt intracranial hemorrhage, meningitis, encephalitis, stroke, cavernous sinus thrombosis.  I do not feel he needs emergent imaging of his head today.   At this time, I do not feel there is any life-threatening condition present. I have reviewed and discussed all results (EKG, imaging, lab, urine as appropriate) and exam findings with patient/family. I have reviewed nursing notes and appropriate previous records.  I feel the patient is safe to be discharged home without further emergent workup and can continue workup as an outpatient as needed. Discussed usual and customary return precautions. Patient/family verbalize understanding and are comfortable with this plan.  Outpatient follow-up has been provided as needed. All questions have been answered.      Ward, Layla Maw,  DO 04/13/18 [phone]

## 2018-04-13 NOTE — ED Notes (Signed)
Pt called for vitals recheck no answer x2

## 2018-04-13 NOTE — ED Notes (Signed)
Pt also c/o tinnitus

## 2018-04-13 NOTE — ED Notes (Signed)
Called pt x1 for vitals recheck no answer

## 2018-04-13 NOTE — ED Triage Notes (Signed)
Pt reports ringing in his ears all day accompanied by a headache.

## 2018-04-13 NOTE — Discharge Instructions (Addendum)
You may alternate Tylenol 1000 mg every 6 hours as needed for pain and Ibuprofen 800 mg every 8 hours as needed for pain.  Please take Ibuprofen with food. ° °

## 2018-04-13 NOTE — ED Notes (Signed)
Patient called for vitals recheck x3 with no answer

## 2018-05-01 DIAGNOSIS — K006 Disturbances in tooth eruption: Secondary | ICD-10-CM | POA: Diagnosis not present

## 2018-05-01 DIAGNOSIS — K011 Impacted teeth: Secondary | ICD-10-CM | POA: Diagnosis not present

## 2018-05-29 DIAGNOSIS — K029 Dental caries, unspecified: Secondary | ICD-10-CM | POA: Diagnosis not present

## 2018-07-15 ENCOUNTER — Ambulatory Visit: Payer: Federal, State, Local not specified - PPO | Admitting: Family Medicine

## 2018-07-15 ENCOUNTER — Ambulatory Visit: Payer: Self-pay

## 2018-07-15 ENCOUNTER — Encounter: Payer: Self-pay | Admitting: Family Medicine

## 2018-07-15 ENCOUNTER — Other Ambulatory Visit: Payer: Self-pay

## 2018-07-15 VITALS — BP 108/70 | HR 80 | Temp 98.3°F | Wt 140.1 lb

## 2018-07-15 DIAGNOSIS — R002 Palpitations: Secondary | ICD-10-CM | POA: Diagnosis not present

## 2018-07-15 DIAGNOSIS — R079 Chest pain, unspecified: Secondary | ICD-10-CM

## 2018-07-15 LAB — HEPATIC FUNCTION PANEL
ALT: 14 U/L (ref 0–53)
AST: 17 U/L (ref 0–37)
Albumin: 4.8 g/dL (ref 3.5–5.2)
Alkaline Phosphatase: 63 U/L (ref 39–117)
Bilirubin, Direct: 0.2 mg/dL (ref 0.0–0.3)
Total Bilirubin: 0.9 mg/dL (ref 0.2–1.2)
Total Protein: 7.6 g/dL (ref 6.0–8.3)

## 2018-07-15 LAB — CBC WITH DIFFERENTIAL/PLATELET
Basophils Absolute: 0 10*3/uL (ref 0.0–0.1)
Basophils Relative: 0.5 % (ref 0.0–3.0)
Eosinophils Absolute: 0 10*3/uL (ref 0.0–0.7)
Eosinophils Relative: 0.7 % (ref 0.0–5.0)
HCT: 41.5 % (ref 39.0–52.0)
Hemoglobin: 13.7 g/dL (ref 13.0–17.0)
Lymphocytes Relative: 29 % (ref 12.0–46.0)
Lymphs Abs: 1.1 10*3/uL (ref 0.7–4.0)
MCHC: 33.1 g/dL (ref 30.0–36.0)
MCV: 81.5 fl (ref 78.0–100.0)
Monocytes Absolute: 0.2 10*3/uL (ref 0.1–1.0)
Monocytes Relative: 5.7 % (ref 3.0–12.0)
Neutro Abs: 2.5 10*3/uL (ref 1.4–7.7)
Neutrophils Relative %: 64.1 % (ref 43.0–77.0)
Platelets: 156 10*3/uL (ref 150.0–400.0)
RBC: 5.09 Mil/uL (ref 4.22–5.81)
RDW: 12.6 % (ref 11.5–15.5)
WBC: 3.8 10*3/uL — ABNORMAL LOW (ref 4.0–10.5)

## 2018-07-15 LAB — BASIC METABOLIC PANEL
BUN: 10 mg/dL (ref 6–23)
CO2: 30 mEq/L (ref 19–32)
Calcium: 9.8 mg/dL (ref 8.4–10.5)
Chloride: 98 mEq/L (ref 96–112)
Creatinine, Ser: 0.93 mg/dL (ref 0.40–1.50)
GFR: 119.52 mL/min (ref >60.00)
Glucose, Bld: 88 mg/dL (ref 70–99)
Potassium: 4.1 mEq/L (ref 3.5–5.1)
Sodium: 138 mEq/L (ref 135–145)

## 2018-07-15 LAB — POC URINALSYSI DIPSTICK (AUTOMATED)
Bilirubin, UA: NEGATIVE
Blood, UA: NEGATIVE
Glucose, UA: NEGATIVE
Ketones, UA: NEGATIVE
Leukocytes, UA: NEGATIVE
Nitrite, UA: NEGATIVE
Protein, UA: POSITIVE — AB
Spec Grav, UA: 1.01 (ref 1.010–1.025)
Urobilinogen, UA: 0.2 E.U./dL
pH, UA: 8.5 — AB (ref 5.0–8.0)

## 2018-07-15 LAB — TSH: TSH: 1.49 u[IU]/mL (ref 0.35–4.50)

## 2018-07-15 NOTE — Telephone Encounter (Signed)
Pt scheduled for virtual OV today.  

## 2018-07-15 NOTE — Progress Notes (Signed)
   Subjective:    Patient ID: Erik Yang, male    DOB: February 18, 1996, 23 y.o.   MRN: [id number]  HPI Here for 2 days of rapid heart beats, occasional lightheadedness, and frequent urinations. No urinary urgency or burning. No chest pain or SOB. No headache or ST or cough. He takes no medications or supplements, and he uses very little caffeine. He sleeps well and has a normal appetite. He denies felling any anxiety or stress.    Review of Systems  Constitutional: Negative.   HENT: Negative.   Eyes: Negative.   Respiratory: Positive for chest tightness. Negative for cough, choking, shortness of breath and wheezing.   Cardiovascular: Positive for palpitations. Negative for chest pain and leg swelling.  Gastrointestinal: Negative.   Genitourinary: Positive for frequency. Negative for dysuria, hematuria and urgency.  Neurological: Negative.        Objective:   Physical Exam Constitutional:      General: He is not in acute distress.    Appearance: Normal appearance.  Neck:     Musculoskeletal: Normal range of motion.  Cardiovascular:     Rate and Rhythm: Normal rate and regular rhythm.     Pulses: Normal pulses.     Heart sounds: Normal heart sounds.     Comments: EKG is normal  Pulmonary:     Effort: Pulmonary effort is normal. No respiratory distress.     Breath sounds: Normal breath sounds. No stridor. No wheezing, rhonchi or rales.  Abdominal:     General: Abdomen is flat. Bowel sounds are normal. There is no distension.     Palpations: Abdomen is soft. There is no mass.     Tenderness: There is no abdominal tenderness. There is no guarding or rebound.     Hernia: No hernia is present.  Lymphadenopathy:     Cervical: No cervical adenopathy.  Neurological:     General: No focal deficit present.     Mental Status: He is alert and oriented to person, place, and time.           Assessment & Plan:  He has vague complaints of rapid heart beats and chest "feeling funny".  I think it is likely that anxiety is playing a large role in this. We will get labs today to rule out diabetes, anemia, thyroid disorders, etc.  Gershon Crane, MD

## 2018-07-15 NOTE — Telephone Encounter (Signed)
Pt. Called to report onset of fast heart beat last night when he laid down about 11:00 PM.  Stated he felt like his heart was pounding and beating fast.  Denied chest pain at that time.  Stated he was not short of breath.  C/o intermittent pain that starts in the navel area and radiates to the left chest.  Rated the pain at 2/10.  Stated he was not able to sleep, due to his heart rate beating fast throughout the night.  At present stated that it is not beating as fast as it was, but that he doesn't feel it is back to normal.  Reported he feels a pain in the left chest with taking a normal breath.  Denied dizziness.  Denied swelling of feet/ ankles.  C/o frequent bowel movements and freq. urination.  Stated his bowels are normal consistency.  C/o feeling like he has a fever; "I feel warm".  Has not checked his temperature.  Advised he will need to be evaluated at an UC or ER.  Advised to arrange transportation, and not to drive himself; stated he can call his mother.  Recommended to go as soon as he can arrange transportation.  Pt. Verb. Understanding.     Reason for Disposition . [1] Heart beating very rapidly (e.g., > 140 / minute) AND [2] not present now  (Exception: during exercise)  Answer Assessment - Initial Assessment Questions 1. DESCRIPTION: "Please describe your heart rate or heart beat that you are having" (e.g., fast/slow, regular/irregular, skipped or extra beats, "palpitations")     Felt heart was pounding and fast 2. ONSET: "When did it start?" (Minutes, hours or days)      About 11:00 PM when he laid down and lasted through the night   3. DURATION: "How long does it last" (e.g., seconds, minutes, hours)     For about 8 hrs.  4. PATTERN "Does it come and go, or has it been constant since it started?"  "Does it get worse with exertion?"   "Are you feeling it now?"     Comes and goes; lasts a long time.   5. TAP: "Using your hand, can you tap out what you are feeling on a chair or table in  front of you, so that I can hear?" (Note: not all patients can do this)       *No Answer* 6. HEART RATE: "Can you tell me your heart rate?" "How many beats in 15 seconds?"  (Note: not all patients can do this)       Does not know how to check his pulse  7. RECURRENT SYMPTOM: "Have you ever had this before?" If so, ask: "When was the last time?" and "What happened that time?"      Remembered having some chest discomfort in the 8th grade.  8. CAUSE: "What do you think is causing the palpitations?"     unknown 9. CARDIAC HISTORY: "Do you have any history of heart disease?" (e.g., heart attack, angina, bypass surgery, angioplasty, arrhythmia)  Had chest pain in the 8th grade; did not diag. any heart problem  10. OTHER SYMPTOMS: "Do you have any other symptoms?" (e.g., dizziness, chest pain, sweating, difficulty breathing)      denied any shortness of breath; c/o discomfort in the navel area that travels to the left chest, c/o feels some pain in the left chest with taking normal breath; denied dizziness; feels like he has a fever; stated "feels warm"; reported having frequent bowel movements  and freq. urination; no abdominal bloating,  11. PREGNANCY: "Is there any chance you are pregnant?" "When was your last menstrual period?"      N/a  Protocols used: HEART RATE AND HEARTBEAT QUESTIONS-A-AH

## 2018-07-15 NOTE — Telephone Encounter (Signed)
Questions for Screening COVID-19  Symptom onset: N/A    Travel or Contacts: No   During this illness, did/does the patient experience any of the following symptoms? Fever >100.88F []   Yes [x]   No []   Unknown Subjective fever (felt feverish) []   Yes [x]   No []   Unknown Chills []   Yes [x]   No []   Unknown Muscle aches (myalgia) []   Yes [x]   No []   Unknown Runny nose (rhinorrhea) []   Yes [x]   No []   Unknown Sore throat []   Yes [x]   No []   Unknown Cough (new onset or worsening of chronic cough) []   Yes [x]   No []   Unknown Shortness of breath (dyspnea) []   Yes [x]   No []   Unknown Nausea or vomiting []   Yes [x]   No []   Unknown Headache []   Yes [x]   No []   Unknown Abdominal pain  []   Yes [x]   No []   Unknown Diarrhea (?3 loose/looser than normal stools/24hr period) []   Yes [x]   No []   Unknown Other, specify:  Patient risk factors: Smoker? []   Current []   Former [x]   Never If male, currently pregnant? []   Yes []   No  Patient Active Problem List   Diagnosis Date Noted  . Migraine 01/12/2016  . Urinary incontinence 07/14/2013  . PTSD (post-traumatic stress disorder) 07/14/2013  . INFLUENZA 01/13/2008  . CHEST PAIN 04/25/2007    Plan:   No risk identified. Patient scheduled to see PCP at 10:30 AM   Note: Referral to telemedicine is an appropriate alternative disposition for higher risk but stable. [facility]/e- ApptiVisit: [phone].

## 2018-07-17 ENCOUNTER — Encounter: Payer: Self-pay | Admitting: *Deleted

## 2018-07-17 ENCOUNTER — Telehealth: Payer: Self-pay | Admitting: *Deleted

## 2018-07-17 NOTE — Telephone Encounter (Signed)
Copied from CRM [id number]. Topic: Quick Communication - See Telephone Encounter >> Jul 16, 2018  8:56 AM Aretta Nip wrote: CRM for notification. See Telephone encounter for: 07/16/18. PT is calling for his lab results from 4/20 which have not been released but are in. He is very anxious about the results. Please call at [phone]    My chart message has been sent to the pt.

## 2018-07-18 MED ORDER — ESCITALOPRAM OXALATE 10 MG PO TABS: 10.0000 mg | ORAL_TABLET | Freq: Every day | ORAL | 2 refills | Status: DC

## 2018-07-18 NOTE — Telephone Encounter (Signed)
Dr. Clent Ridges please advise.  Pt would like to treat.  thanks

## 2018-07-18 NOTE — Telephone Encounter (Signed)
Start him on Lexapro 10 mg daily. Call in #30 with 2 rf. This will take 1-2 weeks to be effective so he needs to be patient with it

## 2018-07-19 ENCOUNTER — Emergency Department (HOSPITAL_COMMUNITY)
Admission: EM | Admit: 2018-07-19 | Discharge: 2018-07-19 | Disposition: A | Discharge status: Home / Self Care | Payer: Federal, State, Local not specified - PPO | Attending: Emergency Medicine | Admitting: Emergency Medicine

## 2018-07-19 ENCOUNTER — Emergency Department (HOSPITAL_COMMUNITY): Payer: Federal, State, Local not specified - PPO

## 2018-07-19 ENCOUNTER — Other Ambulatory Visit: Payer: Self-pay

## 2018-07-19 ENCOUNTER — Encounter (HOSPITAL_COMMUNITY): Payer: Self-pay | Admitting: Emergency Medicine

## 2018-07-19 DIAGNOSIS — R0602 Shortness of breath: Secondary | ICD-10-CM | POA: Insufficient documentation

## 2018-07-19 DIAGNOSIS — R001 Bradycardia, unspecified: Secondary | ICD-10-CM | POA: Diagnosis not present

## 2018-07-19 DIAGNOSIS — R002 Palpitations: Secondary | ICD-10-CM | POA: Diagnosis not present

## 2018-07-19 DIAGNOSIS — R0789 Other chest pain: Secondary | ICD-10-CM | POA: Diagnosis not present

## 2018-07-19 DIAGNOSIS — F4310 Post-traumatic stress disorder, unspecified: Secondary | ICD-10-CM | POA: Diagnosis not present

## 2018-07-19 DIAGNOSIS — R079 Chest pain, unspecified: Secondary | ICD-10-CM | POA: Diagnosis not present

## 2018-07-19 LAB — CBC
HCT: 45.1 % (ref 39.0–52.0)
Hemoglobin: 14 g/dL (ref 13.0–17.0)
MCH: 26.1 pg (ref 26.0–34.0)
MCHC: 31 g/dL (ref 30.0–36.0)
MCV: 84 fL (ref 80.0–100.0)
Platelets: 211 10*3/uL (ref 150–400)
RBC: 5.37 MIL/uL (ref 4.22–5.81)
RDW: 11.7 % (ref 11.5–15.5)
WBC: 4.3 10*3/uL (ref 4.0–10.5)
nRBC: 0 % (ref 0.0–0.2)

## 2018-07-19 LAB — BASIC METABOLIC PANEL
Anion gap: 13 (ref 5–15)
BUN: 11 mg/dL (ref 6–20)
CO2: 26 mmol/L (ref 22–32)
Calcium: 9.9 mg/dL (ref 8.9–10.3)
Chloride: 98 mmol/L (ref 98–111)
Creatinine, Ser: 1.16 mg/dL (ref 0.61–1.24)
GFR calc Af Amer: >60 mL/min (ref >60)
GFR calc non Af Amer: >60 mL/min (ref >60)
Glucose, Bld: 107 mg/dL — ABNORMAL HIGH (ref 70–99)
Potassium: 4.4 mmol/L (ref 3.5–5.1)
Sodium: 137 mmol/L (ref 135–145)

## 2018-07-19 LAB — URINALYSIS, ROUTINE W REFLEX MICROSCOPIC
Bilirubin Urine: NEGATIVE
Glucose, UA: NEGATIVE mg/dL
Hgb urine dipstick: NEGATIVE
Ketones, ur: NEGATIVE mg/dL
Leukocytes,Ua: NEGATIVE
Nitrite: NEGATIVE
Protein, ur: NEGATIVE mg/dL
Specific Gravity, Urine: 1.005 (ref 1.005–1.030)
pH: 7 (ref 5.0–8.0)

## 2018-07-19 LAB — TROPONIN I: Troponin I: <0.03 ng/mL (ref <0.03)

## 2018-07-19 LAB — RAPID URINE DRUG SCREEN, HOSP PERFORMED
Amphetamines: NOT DETECTED
Barbiturates: NOT DETECTED
Benzodiazepines: NOT DETECTED
Cocaine: NOT DETECTED
Opiates: NOT DETECTED
Tetrahydrocannabinol: NOT DETECTED

## 2018-07-19 NOTE — ED Triage Notes (Signed)
Pt states he has been having SOB/ chest fluttering for the past week. Worsening at night. Denies cough/fever. Pt states he feels like he may pass out when he has the SOB/cp episodes.

## 2018-07-19 NOTE — Discharge Instructions (Signed)
We checked your blood work, EKG, chest x-ray and had you on the monitor today.  Everything looked normal without any signs of acute problems with your heart or lungs.  Please be sure to follow-up with your primary care doctor for additional testing and work-up.  Please avoid any caffeinated beverages.  Your symptoms could be related to anxiety but your primary care doctor may run some additional tests in the future. Thank you for allowing me to care for you today. Please return to the emergency department if you have new or worsening symptoms. Take your medications as instructed.

## 2018-07-19 NOTE — ED Provider Notes (Signed)
[facility] EMERGENCY DEPARTMENT Provider Note   CSN: [id number] Arrival date & time: 07/19/18  8657    History   Chief Complaint Chief Complaint  Patient presents with  . Shortness of Breath    HPI Erik Yang is a 23 y.o. male.     Patient is a 23 year old male with past medical history of migraine headaches, PTSD who presents emergency department for intermittent chest pain, shortness of breath and palpitations.  Patient reports that for about 1 week he has been having episodes which last a couple of minutes of feeling like his "breathing and heart do not think up".  Reports that this usually happens when he tries to lay down and go to sleep and will continue to happen throughout the night.  Denies any exacerbating or relieving factors.  Reports that he did see his primary care doctor for this a couple of days ago.  I reviewed the notes in the chart and he had some lab work done which was unremarkable including a normal TSH.  Primary care doctor thought this may be related to anxiety.  Patient denies any drug or alcohol use.  Denies any excessive caffeine intake.  He currently denies any symptoms at all right now.  He denies any significant family cardiac history.     History reviewed. No pertinent past medical history.  Patient Active Problem List   Diagnosis Date Noted  . Migraine 01/12/2016  . Urinary incontinence 07/14/2013  . PTSD (post-traumatic stress disorder) 07/14/2013  . INFLUENZA 01/13/2008  . CHEST PAIN 04/25/2007    History reviewed. No pertinent surgical history.      Home Medications    Prior to Admission medications   Medication Sig Start Date End Date Taking? Authorizing Provider  escitalopram (LEXAPRO) 10 MG tablet Take 1 tablet (10 mg total) by mouth daily. 07/18/18   Nelwyn Salisbury, MD  HYDROcodone-acetaminophen (NORCO) 5-325 MG tablet Take 1 tablet by mouth every 6 (six) hours as needed for moderate pain. Patient not taking:  Reported on 07/14/2017 01/12/16   Nelwyn Salisbury, MD  loperamide (IMODIUM) 2 MG capsule Take 1 capsule (2 mg total) by mouth 4 (four) times daily as needed for diarrhea or loose stools. 12/29/17   Lawyer, Cristal Deer, PA-C  promethazine (PHENERGAN) 25 MG tablet Take 1 tablet (25 mg total) by mouth every 6 (six) hours as needed for nausea or vomiting. 12/29/17   Charlestine Night, PA-C    Family History History reviewed. No pertinent family history.  Social History Social History   Tobacco Use  . Smoking status: Never Smoker  . Smokeless tobacco: Never Used  Substance Use Topics  . Alcohol use: No    Alcohol/week: 0.0 standard drinks  . Drug use: No     Allergies   Patient has no known allergies.   Review of Systems Review of Systems  Constitutional: Negative for chills, diaphoresis and fever.  HENT: Negative for ear pain and sore throat.   Eyes: Negative for pain and visual disturbance.  Respiratory: Positive for shortness of breath. Negative for cough.   Cardiovascular: Positive for chest pain and palpitations.  Gastrointestinal: Negative for abdominal pain, diarrhea, nausea and vomiting.  Genitourinary: Negative for dysuria and hematuria.  Musculoskeletal: Negative for arthralgias and back pain.  Skin: Negative for color change and rash.  Neurological: Negative for dizziness, seizures, syncope, light-headedness and headaches.  Psychiatric/Behavioral: Positive for sleep disturbance. The patient is not nervous/anxious and is not hyperactive.   All other  systems reviewed and are negative.    Physical Exam Updated Vital Signs BP 115/80   Pulse (!) 56   Temp 98.4 F (36.9 C) (Oral)   Resp (!) 21   Ht 5\' 11"  (1.803 m)   Wt 63.5 kg   SpO2 100%   BMI 19.53 kg/m   Physical Exam Vitals signs and nursing note reviewed.  Constitutional:      General: He is not in acute distress.    Appearance: Normal appearance. He is well-developed. He is not ill-appearing,  toxic-appearing or diaphoretic.  HENT:     Head: Normocephalic.  Eyes:     Conjunctiva/sclera: Conjunctivae normal.     Pupils: Pupils are equal, round, and reactive to light.  Cardiovascular:     Rate and Rhythm: Normal rate and regular rhythm.  Pulmonary:     Effort: Pulmonary effort is normal.     Breath sounds: Normal breath sounds.  Skin:    General: Skin is dry.     Capillary Refill: Capillary refill takes less than 2 seconds.  Neurological:     General: No focal deficit present.     Mental Status: He is alert.  Psychiatric:        Mood and Affect: Mood normal.      ED Treatments / Results  Labs (all labs ordered are listed, but only abnormal results are displayed) Labs Reviewed  BASIC METABOLIC PANEL - Abnormal; Notable for the following components:      Result Value   Glucose, Bld 107 (*)    All other components within normal limits  URINALYSIS, ROUTINE W REFLEX MICROSCOPIC - Abnormal; Notable for the following components:   Color, Urine STRAW (*)    All other components within normal limits  CBC  TROPONIN I  RAPID URINE DRUG SCREEN, HOSP PERFORMED    EKG EKG Interpretation  Date/Time:  Friday July 19 2018 07:25:10 EDT Ventricular Rate:  49 PR Interval:    QRS Duration: 103 QT Interval:  444 QTC Calculation: 401 R Axis:   85 Text Interpretation:  Sinus bradycardia RSR' in V1 or V2, probably normal variant Inferior infarct, acute (LCx) ST elevation, consider anterior injury Lateral leads are also involved Baseline wander in lead(s) V3 since 2009, no changes in EKG Confirmed by Eber Hong ([id number]) on 07/19/2018 7:30:54 AM   Radiology Dg Chest Port 1 View  Result Date: 07/19/2018 CLINICAL DATA:  23 year old male with chest pain. EXAM: PORTABLE CHEST 1 VIEW COMPARISON:  Chest radiographs 02/21/2013. FINDINGS: Portable AP upright view at 0731 hours. Lung volumes and mediastinal contours are normal. Visualized tracheal air column is within normal limits.  Allowing for portable technique the lungs are clear. No pneumothorax. Negative visible bowel gas pattern and osseous structures. IMPRESSION: Negative portable chest. Electronically Signed   By: Odessa Fleming M.D.   On: 07/19/2018 07:40    Procedures Procedures (including critical care time)  Medications Ordered in ED Medications - No data to display   Initial Impression / Assessment and Plan / ED Course  I have reviewed the triage vital signs and the nursing notes.  Pertinent labs & imaging results that were available during my care of the patient were reviewed by me and considered in my medical decision making (see chart for details).  Clinical Course as of Jul 18 924  Fri Jul 19, 2018  7902 EKG reviewed by myself and Dr. Hyacinth Meeker.  No changes from previous EKG   [KM]    Clinical Course User Index [KM]  Arlyn DunningMcLean, Jerris Keltz A, PA-C       Based on review of vitals, medical screening exam, lab work and/or imaging, there does not appear to be an acute, emergent etiology for the patient's symptoms. Counseled pt on good return precautions and encouraged both PCP and ED follow-up as needed.  Prior to discharge, I also discussed incidental imaging findings with patient in detail and advised appropriate, recommended follow-up in detail.  Clinical Impression: 1. Palpitation     Disposition: Discharge  Prior to providing a prescription for a controlled substance, I independently reviewed the patient's recent prescription history on the [place] Controlled Substance Reporting System. The patient had no recent or regular prescriptions and was deemed appropriate for a brief, less than 3 day prescription of narcotic for acute analgesia.  This note was prepared with assistance of Conservation officer, historic buildingsDragon voice recognition software. Occasional wrong-word or sound-a-like substitutions may have occurred due to the inherent limitations of voice recognition software.   Final Clinical Impressions(s) / ED Diagnoses    Final diagnoses:  Palpitation    ED Discharge Orders    None       Jeral PinchMcLean, Elham Fini A, PA-C 07/19/18 [id number]    Benjiman CorePickering, Nathan, MD 07/19/18 [phone]

## 2018-07-19 NOTE — ED Notes (Signed)
ED Provider at bedside. 

## 2018-08-04 ENCOUNTER — Encounter (HOSPITAL_COMMUNITY): Payer: Self-pay | Admitting: *Deleted

## 2018-08-04 ENCOUNTER — Emergency Department (HOSPITAL_COMMUNITY)
Admission: EM | Admit: 2018-08-04 | Discharge: 2018-08-04 | Disposition: A | Discharge status: Home / Self Care | Payer: Federal, State, Local not specified - PPO | Attending: Emergency Medicine | Admitting: Emergency Medicine

## 2018-08-04 ENCOUNTER — Other Ambulatory Visit: Payer: Self-pay

## 2018-08-04 DIAGNOSIS — J339 Nasal polyp, unspecified: Secondary | ICD-10-CM | POA: Diagnosis not present

## 2018-08-04 DIAGNOSIS — Z79899 Other long term (current) drug therapy: Secondary | ICD-10-CM | POA: Diagnosis not present

## 2018-08-04 DIAGNOSIS — J3489 Other specified disorders of nose and nasal sinuses: Secondary | ICD-10-CM | POA: Diagnosis not present

## 2018-08-04 MED ORDER — FLUTICASONE PROPIONATE 50 MCG/ACT NA SUSP: 1.0000 | Freq: Every day | NASAL | 0 refills | Status: DC

## 2018-08-04 NOTE — ED Provider Notes (Signed)
[facility] EMERGENCY DEPARTMENT Provider Note   CSN: [id number] Arrival date & time: 08/04/18  1845    History   Chief Complaint Chief Complaint  Patient presents with  . URI    nasal congestion    HPI Tyon D Manson PasseyBrown is a 23 y.o. male.     23 y.o male with a PMH of PTSD, migraine presents to the ED with a chief complaint of left nostril pain x 2 days.  Patient reports he was feeling pain along his left nostril, states he felt like he was not getting enough air on the left side.  Patient reports he googled this on which stated he may have sinusitis.  Patient has not tried any medical treatment for relieving symptoms.  He does endorse some mild rhinorrhea in the mornings, does not report a previous history of allergies.  Denies any sore throat, fever, shortness of breath or chest pain.     History reviewed. No pertinent past medical history.  Patient Active Problem List   Diagnosis Date Noted  . Migraine 01/12/2016  . Urinary incontinence 07/14/2013  . PTSD (post-traumatic stress disorder) 07/14/2013  . INFLUENZA 01/13/2008  . CHEST PAIN 04/25/2007    History reviewed. No pertinent surgical history.      Home Medications    Prior to Admission medications   Medication Sig Start Date End Date Taking? Authorizing Provider  escitalopram (LEXAPRO) 10 MG tablet Take 1 tablet (10 mg total) by mouth daily. 07/18/18   Nelwyn SalisburyFry, Stephen A, MD  fluticasone (FLONASE) 50 MCG/ACT nasal spray Place 1 spray into both nostrils daily for 5 days. 08/04/18 08/09/18  Claude MangesSoto, Luann Aspinwall, PA-C  HYDROcodone-acetaminophen (NORCO) 5-325 MG tablet Take 1 tablet by mouth every 6 (six) hours as needed for moderate pain. Patient not taking: Reported on 07/14/2017 01/12/16   Nelwyn SalisburyFry, Stephen A, MD  loperamide (IMODIUM) 2 MG capsule Take 1 capsule (2 mg total) by mouth 4 (four) times daily as needed for diarrhea or loose stools. 12/29/17   Lawyer, Cristal Deerhristopher, PA-C  promethazine (PHENERGAN) 25 MG  tablet Take 1 tablet (25 mg total) by mouth every 6 (six) hours as needed for nausea or vomiting. 12/29/17   Charlestine NightLawyer, Christopher, PA-C    Family History History reviewed. No pertinent family history.  Social History Social History   Tobacco Use  . Smoking status: Never Smoker  . Smokeless tobacco: Never Used  Substance Use Topics  . Alcohol use: No    Alcohol/week: 0.0 standard drinks  . Drug use: No     Allergies   Patient has no known allergies.   Review of Systems Review of Systems  Constitutional: Negative for fever.  HENT: Positive for rhinorrhea and sinus pressure.      Physical Exam Updated Vital Signs BP 119/70   Pulse 82   Temp 98.4 F (36.9 C) (Oral)   Resp 16   Ht 5\' 11"  (1.803 m)   Wt 63 kg   SpO2 100%   BMI 19.37 kg/m   Physical Exam Vitals signs and nursing note reviewed.  Constitutional:      Appearance: He is well-developed.     Comments: Well-appearing.  HENT:     Head: Normocephalic and atraumatic.     Nose: Congestion present.     Right Nostril: No occlusion.     Left Nostril: No occlusion.     Right Turbinates: Enlarged.     Left Turbinates: Enlarged.     Comments: Bilateral nasal polyps, larger on left  nostril > then right nostril. Eyes:     General: No scleral icterus.    Pupils: Pupils are equal, round, and reactive to light.  Neck:     Musculoskeletal: Normal range of motion.  Cardiovascular:     Heart sounds: Normal heart sounds.  Pulmonary:     Effort: Pulmonary effort is normal.     Breath sounds: Normal breath sounds. No wheezing.  Chest:     Chest wall: No tenderness.  Abdominal:     General: Bowel sounds are normal. There is no distension.     Palpations: Abdomen is soft.     Tenderness: There is no abdominal tenderness.  Musculoskeletal:        General: No tenderness or deformity.  Skin:    General: Skin is warm and dry.  Neurological:     Mental Status: He is alert and oriented to person, place, and time.       ED Treatments / Results  Labs (all labs ordered are listed, but only abnormal results are displayed) Labs Reviewed - No data to display  EKG None  Radiology No results found.  Procedures Procedures (including critical care time)  Medications Ordered in ED Medications - No data to display   Initial Impression / Assessment and Plan / ED Course  I have reviewed the triage vital signs and the nursing notes.  Pertinent labs & imaging results that were available during my care of the patient were reviewed by me and considered in my medical decision making (see chart for details).     Patient with a past medical history of PTSD presents to the ED with complaints of left nostril pain x2 days.  Reports no medical therapy for relieving symptoms.  States he has googled this on the Internet which show he may have sinusitis.  Reports he cannot smell out of the left nostril.  He does not report any previous history of allergies.  During evaluation patient is well-appearing, afebrile with stable vital signs.  Both nostrils appear enlarged, nasal polyps present greater on the left than right side. Patient does report some rhinorrhea, will treat with nasal spray along with antihistamine over-the-counter.  Patient is to follow-up with his PCP for further management.  Return precautions provided.   Portions of this note were generated with Scientist, clinical (histocompatibility and immunogenetics). Dictation errors may occur despite best attempts at proofreading.    Final Clinical Impressions(s) / ED Diagnoses   Final diagnoses:  Nasal polyp    ED Discharge Orders         Ordered    fluticasone (FLONASE) 50 MCG/ACT nasal spray  Daily     08/04/18 1933           Claude Manges, Cordelia Poche 08/04/18 1935    Tegeler, Canary Brim, MD 08/05/18 [phone]

## 2018-08-04 NOTE — Discharge Instructions (Addendum)
You may purchase an antihistamine such as Claritin, Zyrtec's to help with your runny nose.  You may also use the Flonase spray which I have written a prescription for to help open up your nostrils.  Please follow-up with your primary care physician as needed.

## 2018-08-04 NOTE — ED Notes (Signed)
Discharge instructions and perscription reviewed with pt. Pt has no questions at this time.

## 2018-08-04 NOTE — ED Triage Notes (Signed)
PT reports the left side of his nose is closed shut. Pt denies any nasal bleeding.

## 2018-08-07 DIAGNOSIS — J302 Other seasonal allergic rhinitis: Secondary | ICD-10-CM | POA: Diagnosis not present

## 2018-08-07 DIAGNOSIS — J343 Hypertrophy of nasal turbinates: Secondary | ICD-10-CM | POA: Diagnosis not present

## 2018-08-08 DIAGNOSIS — H52223 Regular astigmatism, bilateral: Secondary | ICD-10-CM | POA: Diagnosis not present

## 2018-08-08 DIAGNOSIS — H0288B Meibomian gland dysfunction left eye, upper and lower eyelids: Secondary | ICD-10-CM | POA: Diagnosis not present

## 2018-08-08 DIAGNOSIS — H11132 Conjunctival pigmentations, left eye: Secondary | ICD-10-CM | POA: Diagnosis not present

## 2018-08-08 DIAGNOSIS — H1045 Other chronic allergic conjunctivitis: Secondary | ICD-10-CM | POA: Diagnosis not present

## 2018-08-08 DIAGNOSIS — H5213 Myopia, bilateral: Secondary | ICD-10-CM | POA: Diagnosis not present

## 2018-08-08 DIAGNOSIS — H0288A Meibomian gland dysfunction right eye, upper and lower eyelids: Secondary | ICD-10-CM | POA: Diagnosis not present

## 2018-08-10 ENCOUNTER — Other Ambulatory Visit: Payer: Self-pay | Admitting: Family Medicine

## 2018-09-23 DIAGNOSIS — J302 Other seasonal allergic rhinitis: Secondary | ICD-10-CM | POA: Diagnosis not present

## 2018-09-23 DIAGNOSIS — J343 Hypertrophy of nasal turbinates: Secondary | ICD-10-CM | POA: Diagnosis not present

## 2018-09-23 DIAGNOSIS — J3489 Other specified disorders of nose and nasal sinuses: Secondary | ICD-10-CM | POA: Diagnosis not present

## 2018-09-23 DIAGNOSIS — J310 Chronic rhinitis: Secondary | ICD-10-CM | POA: Diagnosis not present

## 2018-10-23 DIAGNOSIS — J310 Chronic rhinitis: Secondary | ICD-10-CM | POA: Diagnosis not present

## 2018-10-23 DIAGNOSIS — J342 Deviated nasal septum: Secondary | ICD-10-CM | POA: Diagnosis not present

## 2018-10-23 DIAGNOSIS — J3489 Other specified disorders of nose and nasal sinuses: Secondary | ICD-10-CM | POA: Diagnosis not present

## 2018-10-23 DIAGNOSIS — J341 Cyst and mucocele of nose and nasal sinus: Secondary | ICD-10-CM | POA: Diagnosis not present

## 2018-10-23 DIAGNOSIS — J343 Hypertrophy of nasal turbinates: Secondary | ICD-10-CM | POA: Diagnosis not present

## 2018-11-15 ENCOUNTER — Encounter: Payer: Self-pay | Admitting: Family Medicine

## 2018-11-15 ENCOUNTER — Ambulatory Visit (INDEPENDENT_AMBULATORY_CARE_PROVIDER_SITE_OTHER): Payer: Federal, State, Local not specified - PPO | Admitting: Family Medicine

## 2018-11-15 VITALS — BP 120/64 | HR 56 | Temp 98.0°F | Wt 144.2 lb

## 2018-11-15 DIAGNOSIS — L723 Sebaceous cyst: Secondary | ICD-10-CM

## 2018-11-15 NOTE — Progress Notes (Signed)
   Subjective:    Patient ID: Erik Yang, male    DOB: [birth year], 23 y.o.   MRN: [id number]  HPI Here for 2 days of a non-tender bump on the neck. He feels fine in general.   Review of Systems  Constitutional: Negative.   HENT: Negative.   Respiratory: Negative.   Cardiovascular: Negative.   Hematological: Negative for adenopathy.       Objective:   Physical Exam Constitutional:      Appearance: Normal appearance.  Neck:     Musculoskeletal: Neck supple.  Cardiovascular:     Rate and Rhythm: Normal rate and regular rhythm.     Pulses: Normal pulses.     Heart sounds: Normal heart sounds.  Pulmonary:     Effort: Pulmonary effort is normal.     Breath sounds: Normal breath sounds.  Lymphadenopathy:     Cervical: No cervical adenopathy.  Skin:    Comments: There is small non-tender sebaceous cyst on the right anterior neck.   Neurological:     Mental Status: He is alert.           Assessment & Plan:  Sebaceous cyst, this was emptied with gentle pressure. Recheck prn. Alysia Penna, MD

## 2019-03-26 ENCOUNTER — Telehealth (INDEPENDENT_AMBULATORY_CARE_PROVIDER_SITE_OTHER): Payer: Federal, State, Local not specified - PPO | Admitting: Family Medicine

## 2019-03-26 ENCOUNTER — Other Ambulatory Visit: Payer: Self-pay

## 2019-03-26 DIAGNOSIS — G43119 Migraine with aura, intractable, without status migrainosus: Secondary | ICD-10-CM

## 2019-03-26 MED ORDER — SUMATRIPTAN SUCCINATE 100 MG PO TABS: 100.0000 mg | ORAL_TABLET | ORAL | 11 refills | Status: DC | PRN

## 2019-03-26 NOTE — Progress Notes (Signed)
Virtual Visit via Video Note  I connected with the patient on 03/26/19 at  8:30 AM EST by a video enabled telemedicine application and verified that I am speaking with the correct person using two identifiers.  Location patient: home Location provider:work or home office Persons participating in the virtual visit: patient, provider  I discussed the limitations of evaluation and management by telemedicine and the availability of in person appointments. The patient expressed understanding and agreed to proceed.   HPI: Here to discuss his migraines. They used to be sporadic, but over the last few weeks they have become more frequent. He often wakes up with a migraine. These are global throbbing headaches. He has been taking Ibuprofen or simply waiting them out. They do not cause nausea or vomiting. He has never tried a triptan medication apparently.   ROS: See pertinent positives and negatives per HPI.  No past medical history on file.  No past surgical history on file.  No family history on file.   Current Outpatient Medications:  .  escitalopram (LEXAPRO) 10 MG tablet, TAKE 1 TABLET BY MOUTH EVERY DAY, Disp: 90 tablet, Rfl: 1 .  fluticasone (FLONASE) 50 MCG/ACT nasal spray, Place 1 spray into both nostrils daily for 5 days., Disp: 1 g, Rfl: 0 .  loperamide (IMODIUM) 2 MG capsule, Take 1 capsule (2 mg total) by mouth 4 (four) times daily as needed for diarrhea or loose stools., Disp: 12 capsule, Rfl: 0 .  promethazine (PHENERGAN) 25 MG tablet, Take 1 tablet (25 mg total) by mouth every 6 (six) hours as needed for nausea or vomiting., Disp: 10 tablet, Rfl: 0 .  SUMAtriptan (IMITREX) 100 MG tablet, Take 1 tablet (100 mg total) by mouth as needed for migraine. May repeat in 2 hours if headache persists or recurs., Disp: 10 tablet, Rfl: 11  EXAM:  VITALS per patient if applicable:  GENERAL: alert, oriented, appears well and in no acute distress  HEENT: atraumatic, conjunttiva clear, no  obvious abnormalities on inspection of external nose and ears  NECK: normal movements of the head and neck  LUNGS: on inspection no signs of respiratory distress, breathing rate appears normal, no obvious gross SOB, gasping or wheezing  CV: no obvious cyanosis  MS: moves all visible extremities without noticeable abnormality  PSYCH/NEURO: pleasant and cooperative, no obvious depression or anxiety, speech and thought processing grossly intact  ASSESSMENT AND PLAN: Migraines, he will try Imitrex tablets as needed. Recheck prn.  Alysia Penna, MD  Discussed the following assessment and plan:  No diagnosis found.     I discussed the assessment and treatment plan with the patient. The patient was provided an opportunity to ask questions and all were answered. The patient agreed with the plan and demonstrated an understanding of the instructions.   The patient was advised to call back or seek an in-person evaluation if the symptoms worsen or if the condition fails to improve as anticipated.

## 2019-04-07 ENCOUNTER — Telehealth: Payer: Self-pay | Admitting: *Deleted

## 2019-04-07 DIAGNOSIS — G4730 Sleep apnea, unspecified: Secondary | ICD-10-CM

## 2019-04-07 NOTE — Addendum Note (Signed)
Addended by: Gershon Crane A on: 04/07/2019 04:43 PM   Modules accepted: Orders

## 2019-04-07 NOTE — Telephone Encounter (Signed)
Patient is aware 

## 2019-04-07 NOTE — Telephone Encounter (Signed)
The referral was done  

## 2019-04-07 NOTE — Telephone Encounter (Signed)
Copied from CRM [id number]. Topic: Referral - Request for Referral >> Apr 07, 2019  1:48 PM Dalphine Handing A wrote: Has patient seen PCP for this complaint? Yes *If NO, is insurance requiring patient see PCP for this issue before PCP can refer them? Referral for which specialty: Neurology  Preferred provider/office:[facility]  Reason for referral: Sleep Apnea  Okay for referral?

## 2019-04-14 ENCOUNTER — Emergency Department (HOSPITAL_COMMUNITY): Payer: Federal, State, Local not specified - PPO

## 2019-04-14 ENCOUNTER — Emergency Department (HOSPITAL_COMMUNITY)
Admission: EM | Admit: 2019-04-14 | Discharge: 2019-04-14 | Disposition: A | Discharge status: Home / Self Care | Payer: Federal, State, Local not specified - PPO | Attending: Emergency Medicine | Admitting: Emergency Medicine

## 2019-04-14 ENCOUNTER — Other Ambulatory Visit: Payer: Self-pay

## 2019-04-14 ENCOUNTER — Encounter (HOSPITAL_COMMUNITY): Payer: Self-pay | Admitting: *Deleted

## 2019-04-14 DIAGNOSIS — R519 Headache, unspecified: Secondary | ICD-10-CM | POA: Diagnosis not present

## 2019-04-14 DIAGNOSIS — Z79899 Other long term (current) drug therapy: Secondary | ICD-10-CM | POA: Insufficient documentation

## 2019-04-14 MED ORDER — IBUPROFEN 800 MG PO TABS: 800.0000 mg | ORAL_TABLET | Freq: Three times a day (TID) | ORAL | 0 refills | Status: DC | PRN

## 2019-04-14 NOTE — ED Triage Notes (Signed)
The pt is c/o a rt sided headache for several weeks  When I asked what is different tonight  He just realized how long it had been going on and decided to have it checked

## 2019-04-14 NOTE — ED Notes (Signed)
Pt given dc instructions pt verbalizes understanding.  

## 2019-04-14 NOTE — ED Provider Notes (Signed)
[facility] EMERGENCY DEPARTMENT Provider Note   CSN: [id number] Arrival date & time: 04/14/19  0043     History Chief Complaint  Patient presents with  . Headache    [facility] is a 24 y.o. male.  HPI Patient presents to the emergency department with headache that he states has been ongoing for several weeks.  The patient states he has had headaches in the past but this seems different than his previous headaches.  Patient states that they seem to come and go and cause more severe pain and he is concerned that they may be from a brain tumor.  The patient states that he has not had headaches similar to this in the past.  Patient states that he has no other symptoms associated with this headache.  Patient states he has tried no medications at home for the headaches.  The patient denies chest pain, shortness of breath, blurred vision, neck pain, fever, cough, weakness, numbness, dizziness, anorexia, edema, abdominal pain, nausea, vomiting, diarrhea, rash, back pain, dysuria, hematemesis, bloody stool, near syncope, or syncope.    History reviewed. No pertinent past medical history.  Patient Active Problem List   Diagnosis Date Noted  . Migraine 01/12/2016  . Urinary incontinence 07/14/2013  . PTSD (post-traumatic stress disorder) 07/14/2013  . INFLUENZA 01/13/2008  . CHEST PAIN 04/25/2007    History reviewed. No pertinent surgical history.     No family history on file.  Social History   Tobacco Use  . Smoking status: Never Smoker  . Smokeless tobacco: Never Used  Substance Use Topics  . Alcohol use: No    Alcohol/week: 0.0 standard drinks  . Drug use: No    Home Medications Prior to Admission medications   Medication Sig Start Date End Date Taking? Authorizing Provider  escitalopram (LEXAPRO) 10 MG tablet TAKE 1 TABLET BY MOUTH EVERY DAY 08/21/18   Nelwyn Salisbury, MD  fluticasone [facility]) 50 MCG/ACT nasal spray Place 1 spray into both nostrils  daily for 5 days. 08/04/18 08/09/18  Claude Manges, PA-C  loperamide (IMODIUM) 2 MG capsule Take 1 capsule (2 mg total) by mouth 4 (four) times daily as needed for diarrhea or loose stools. 12/29/17   Rajveer Handler, Cristal Deer, PA-C  promethazine (PHENERGAN) 25 MG tablet Take 1 tablet (25 mg total) by mouth every 6 (six) hours as needed for nausea or vomiting. 12/29/17   Cherese Lozano, Cristal Deer, PA-C  SUMAtriptan (IMITREX) 100 MG tablet Take 1 tablet (100 mg total) by mouth as needed for migraine. May repeat in 2 hours if headache persists or recurs. 03/26/19   Nelwyn Salisbury, MD    Allergies    Patient has no known allergies.  Review of Systems   Review of Systems All other systems negative except as documented in the HPI. All pertinent positives and negatives as reviewed in the HPI. Physical Exam Updated Vital Signs BP 106/77   Pulse (!) 47   Temp 98.1 F (36.7 C) (Oral)   Resp 16   Ht 5\' 10"  (1.778 m)   Wt 61.2 kg   SpO2 100%   BMI 19.37 kg/m   Physical Exam Vitals and nursing note reviewed.  Constitutional:      General: He is not in acute distress.    Appearance: He is well-developed.  HENT:     Head: Normocephalic and atraumatic.  Eyes:     Pupils: Pupils are equal, round, and reactive to light.  Cardiovascular:     Rate and Rhythm:  Normal rate and regular rhythm.     Heart sounds: Normal heart sounds. No murmur. No friction rub. No gallop.   Pulmonary:     Effort: Pulmonary effort is normal. No respiratory distress.     Breath sounds: Normal breath sounds. No wheezing.  Abdominal:     General: Bowel sounds are normal. There is no distension.     Palpations: Abdomen is soft.     Tenderness: There is no abdominal tenderness.  Musculoskeletal:     Cervical back: Normal range of motion and neck supple.  Skin:    General: Skin is warm and dry.     Capillary Refill: Capillary refill takes less than 2 seconds.     Findings: No erythema or rash.  Neurological:     Mental Status: He  is alert and oriented to person, place, and time.     GCS: GCS eye subscore is 4. GCS verbal subscore is 5. GCS motor subscore is 6.     Cranial Nerves: No dysarthria.     Sensory: Sensation is intact. No sensory deficit.     Motor: No weakness or abnormal muscle tone.     Coordination: Coordination is intact. Coordination normal. Finger-Nose-Finger Test and Heel to [facility] Test normal.     Gait: Gait normal.     Deep Tendon Reflexes: Reflexes normal.  Psychiatric:        Speech: Speech normal.        Behavior: Behavior normal.     ED Results / Procedures / Treatments   Labs (all labs ordered are listed, but only abnormal results are displayed) Labs Reviewed - No data to display  EKG None  Radiology No results found.  Procedures Procedures (including critical care time)  Medications Ordered in ED Medications - No data to display  ED Course  I have reviewed the triage vital signs and the nursing notes.  Pertinent labs & imaging results that were available during my care of the patient were reviewed by me and considered in my medical decision making (see chart for details). Patient will have CT scan imaging to further assess for any abnormalities in his brain.  I do feel that this is a low yield but the patient has a very strong concern for an intracranial issue.  The patient's exam does not yield any abnormalities.  Patient will be given NSAIDs for home use for his headaches.  Will be given neurological follow-up of his headaches continue to worsen.   MDM Rules/Calculators/A&P                     The patient's head CT does not show any significant abnormalities at this time.  The patient is advised of the results and I will give him follow-up with neurology if these headaches continue.  We will give him treatment at home with NSAIDs.  The patient is currently not having any headache so therefore there is no treatment initiated here in the emergency department.  I did advise him to  increase his fluid intake and rest as much as possible.  Patient's physical exam findings did not yield any abnormalities that would lead [place] to believe there were some other significant issue going on in his neurological system.  I did advise patient to return here for any worsening in his condition.  The patient agrees this plan and all questions were answered. Final Clinical Impression(s) / ED Diagnoses Final diagnoses:  None    Rx / DC Orders  ED Discharge Orders    None       Dalia Heading, PA-C 04/14/19 0901    Wyvonnia Dusky, MD 04/14/19 [phone]

## 2019-04-14 NOTE — Discharge Instructions (Addendum)
Follow-up with the neurologist provided if your headache does not seem to improve.  Return here for worsening in your condition.  Your CT scan did not show any signs of any abnormalities that would indicate a cause for your headache.  You can also follow-up with your primary doctor as well.

## 2019-04-14 NOTE — ED Triage Notes (Signed)
No headache at present

## 2019-04-23 ENCOUNTER — Institutional Professional Consult (permissible substitution): Payer: Self-pay | Admitting: Neurology

## 2019-04-23 ENCOUNTER — Telehealth: Payer: Self-pay

## 2019-04-23 NOTE — Telephone Encounter (Signed)
Patient no showed 04/23/2019 appointment with Dr. Frances Furbish.

## 2019-06-20 ENCOUNTER — Emergency Department (HOSPITAL_COMMUNITY)
Admission: EM | Admit: 2019-06-20 | Discharge: 2019-06-21 | Disposition: A | Discharge status: Home / Self Care | Payer: PRIVATE HEALTH INSURANCE | Attending: Emergency Medicine | Admitting: Emergency Medicine

## 2019-06-20 ENCOUNTER — Encounter (HOSPITAL_COMMUNITY): Payer: Self-pay | Admitting: Emergency Medicine

## 2019-06-20 ENCOUNTER — Other Ambulatory Visit: Payer: Self-pay

## 2019-06-20 DIAGNOSIS — R519 Headache, unspecified: Secondary | ICD-10-CM | POA: Diagnosis not present

## 2019-06-20 NOTE — ED Triage Notes (Signed)
Pt c/o HA x 1 week, denies n/v/d, denies sinus pain or cough. Pt reports sensitivity to light and sound, no hx of migraines.

## 2019-06-21 ENCOUNTER — Emergency Department (HOSPITAL_COMMUNITY): Payer: PRIVATE HEALTH INSURANCE

## 2019-06-21 DIAGNOSIS — G43909 Migraine, unspecified, not intractable, without status migrainosus: Secondary | ICD-10-CM | POA: Diagnosis not present

## 2019-06-21 MED ORDER — SUMATRIPTAN SUCCINATE 100 MG PO TABS: 100.0000 mg | ORAL_TABLET | ORAL | 0 refills | Status: DC | PRN

## 2019-06-21 NOTE — ED Notes (Signed)
Pt transported to CT ?

## 2019-06-21 NOTE — ED Provider Notes (Signed)
[facility] EMERGENCY DEPARTMENT Provider Note   CSN: [id number] Arrival date & time: 06/20/19  2242     History Chief Complaint  Patient presents with  . Headache    [facility] is a 24 y.o. male with a hx of PTSD, influenza presents to the Emergency Department complaining of gradual, intermittent left-sided headache, onset approximately 1 week ago but not currently present.  Patient denies a history of migraine headaches or headaches in general however he does have a listed past medical history of migraines and sumatriptan listed as one of his home medications.  Patient reports left-sided throbbing headache which was gradual in onset.  He reports it resolves and returns over the last week.  Pain is improved significantly after ibuprofen usage.  He denies aspirin usage or blood thinners.  He denies numbness, tingling, focal weakness, vision changes, nausea, vomiting, recent head injury, otalgia, changes in hearing.  No specific aggravating factors.    The history is provided by the patient and medical records. No language interpreter was used.       History reviewed. No pertinent past medical history.  Patient Active Problem List   Diagnosis Date Noted  . Migraine 01/12/2016  . Urinary incontinence 07/14/2013  . PTSD (post-traumatic stress disorder) 07/14/2013  . INFLUENZA 01/13/2008  . CHEST PAIN 04/25/2007    History reviewed. No pertinent surgical history.     No family history on file.  Social History   Tobacco Use  . Smoking status: Never Smoker  . Smokeless tobacco: Never Used  Substance Use Topics  . Alcohol use: No    Alcohol/week: 0.0 standard drinks  . Drug use: No    Home Medications Prior to Admission medications   Medication Sig Start Date End Date Taking? Authorizing Provider  escitalopram (LEXAPRO) 10 MG tablet TAKE 1 TABLET BY MOUTH EVERY DAY 08/21/18   Nelwyn Salisbury, MD  fluticasone [facility]) 50 MCG/ACT nasal spray Place 1  spray into both nostrils daily for 5 days. 08/04/18 08/09/18  Claude Manges, PA-C  ibuprofen (ADVIL) 800 MG tablet Take 1 tablet (800 mg total) by mouth every 8 (eight) hours as needed. 04/14/19   Lawyer, Cristal Deer, PA-C  loperamide (IMODIUM) 2 MG capsule Take 1 capsule (2 mg total) by mouth 4 (four) times daily as needed for diarrhea or loose stools. 12/29/17   Lawyer, Cristal Deer, PA-C  promethazine (PHENERGAN) 25 MG tablet Take 1 tablet (25 mg total) by mouth every 6 (six) hours as needed for nausea or vomiting. 12/29/17   Lawyer, Cristal Deer, PA-C  SUMAtriptan (IMITREX) 100 MG tablet Take 1 tablet (100 mg total) by mouth as needed for migraine. May repeat in 2 hours if headache persists or recurs. 06/21/19   Wilsie Kern, Dahlia Client, PA-C    Allergies    Patient has no known allergies.  Review of Systems   Review of Systems  Constitutional: Negative for appetite change, diaphoresis, fatigue, fever and unexpected weight change.  HENT: Negative for mouth sores.   Eyes: Negative for visual disturbance.  Respiratory: Negative for cough, chest tightness, shortness of breath and wheezing.   Cardiovascular: Negative for chest pain.  Gastrointestinal: Negative for abdominal pain, constipation, diarrhea, nausea and vomiting.  Endocrine: Negative for polydipsia, polyphagia and polyuria.  Genitourinary: Negative for dysuria, frequency, hematuria and urgency.  Musculoskeletal: Negative for back pain and neck stiffness.  Skin: Negative for rash.  Allergic/Immunologic: Negative for immunocompromised state.  Neurological: Positive for headaches. Negative for syncope and light-headedness.  Hematological: Does not  bruise/bleed easily.  Psychiatric/Behavioral: Negative for sleep disturbance. The patient is not nervous/anxious.     Physical Exam Updated Vital Signs BP 114/78 (BP Location: Left Arm)   Pulse (!) 59   Temp 98.2 F (36.8 C) (Oral)   Resp (!) 22   SpO2 100%   Physical Exam Vitals and  nursing note reviewed.  Constitutional:      General: He is not in acute distress.    Appearance: He is well-developed. He is not diaphoretic.  HENT:     Head: Normocephalic and atraumatic.  Eyes:     General: No scleral icterus.    Conjunctiva/sclera: Conjunctivae normal.     Pupils: Pupils are equal, round, and reactive to light.     Comments: No horizontal, vertical or rotational nystagmus  Neck:     Comments: Full active and passive ROM without pain No midline or paraspinal tenderness No nuchal rigidity or meningeal signs Cardiovascular:     Rate and Rhythm: Normal rate and regular rhythm.  Pulmonary:     Effort: Pulmonary effort is normal. No respiratory distress.  Abdominal:     Palpations: Abdomen is soft.     Tenderness: There is no abdominal tenderness. There is no guarding or rebound.  Musculoskeletal:        General: Normal range of motion.     Cervical back: Normal range of motion and neck supple.  Lymphadenopathy:     Cervical: No cervical adenopathy.  Skin:    General: Skin is warm and dry.     Findings: No rash.  Neurological:     Mental Status: He is alert and oriented to person, place, and time.     Cranial Nerves: No cranial nerve deficit.     Motor: No abnormal muscle tone.     Coordination: Coordination normal.     Comments: Mental Status:  Alert, oriented, thought content appropriate. Speech fluent without evidence of aphasia. Able to follow 2 step commands without difficulty.  Cranial Nerves:  II:  Peripheral visual fields grossly normal, pupils equal, round, reactive to light III,IV, VI: ptosis not present, extra-ocular motions intact bilaterally  V,VII: smile symmetric, facial light touch sensation equal VIII: hearing grossly normal bilaterally  IX,X: midline uvula rise  XI: bilateral shoulder shrug equal and strong XII: midline tongue extension  Motor:  5/5 in upper and lower extremities bilaterally including strong and equal grip strength and  dorsiflexion/plantar flexion Sensory: Pinprick and light touch normal in all extremities.  Cerebellar: normal finger-to-nose with bilateral upper extremities Gait: normal gait and balance CV: distal pulses palpable throughout   Psychiatric:        Behavior: Behavior normal.        Thought Content: Thought content normal.        Judgment: Judgment normal.     ED Results / Procedures / Treatments   Labs (all labs ordered are listed, but only abnormal results are displayed) Labs Reviewed - No data to display  EKG None  Radiology CT Head Wo Contrast  Result Date: 06/21/2019 CLINICAL DATA:  Acute headache EXAM: CT HEAD WITHOUT CONTRAST TECHNIQUE: Contiguous axial images were obtained from the base of the skull through the vertex without intravenous contrast. COMPARISON:  April 14, 2019 FINDINGS: Brain: No evidence of acute territorial infarction, hemorrhage, hydrocephalus,extra-axial collection or mass lesion/mass effect. Normal gray-white differentiation. Ventricles are normal in size and contour. Vascular: No hyperdense vessel or unexpected calcification. Skull: The skull is intact. No fracture or focal lesion identified. Sinuses/Orbits: The  visualized paranasal sinuses and mastoid air cells are clear. The orbits and globes intact. Other: None IMPRESSION: No acute intracranial abnormality. Electronically Signed   By: Jonna Clark M.D.   On: 06/21/2019 01:05    Procedures Procedures (including critical care time)  Medications Ordered in ED Medications - No data to display  ED Course  I have reviewed the triage vital signs and the nursing notes.  Pertinent labs & imaging results that were available during my care of the patient were reviewed by me and considered in my medical decision making (see chart for details).  Clinical Course as of Jun 20 149  Sat Jun 21, 2019  0111 Pt refusing COVID swab.     [HM]    Clinical Course User Index [HM] Seneca Hoback, Boyd Kerbs   MDM  Rules/Calculators/A&P                       Patient presents for 1 week of intermittent left-sided headache.  Normal neurologic exam here in the emergency department.  Records reviewed.  Patient was seen on 04/13/2018 for similar headache.  Work-up at that time was reassuring.  He was seen again in January 2021 for headache.  CT scan at that time was within normal limits.  Given no headache at this time, normal neurologic exam and recent normal head CT risk for intracranial bleed or intracranial mass is very low.  Discussed this with patient.  He reports concern of persistent headaches and wishes to have additional CT scan today.  As he does not have a headache now, he does not want any treatments for headache.  He denies known Covid contacts.  1:51 AM Patient continues to be neurologically intact.  He again declines treatment for headache stating that he does not currently have one.  He has refused Covid swab.  I have discussed with him the potential for Covid given his intermittent headaches.  It appears that primary care had prescribed sumatriptan for migraine headaches in the past.  I refilled this medication for him.  Also recommended that he follow-up with neurology for further evaluation.  Patient states understanding and is in agreement with the plan.    Final Clinical Impression(s) / ED Diagnoses Final diagnoses:  Intermittent headache    Rx / DC Orders ED Discharge Orders         Ordered    SUMAtriptan (IMITREX) 100 MG tablet  As needed     06/21/19 0149           Gray Maugeri, Boyd Kerbs 06/21/19 3734    Geoffery Lyons, MD 06/21/19 [id number]

## 2019-06-21 NOTE — Discharge Instructions (Addendum)
1. Medications: refill of Sumatriptan as needed for persistent headaches; ibuprofen for mild headache with food, usual home medications 2. Treatment: rest, drink plenty of fluids,  3. Follow Up: Please followup with your primary doctor in 2 days and neurology in 1 week for further discussion of your headaches. Please return to the ER for headache associated with fever, vision changes, vomiting or other concerns

## 2019-06-21 NOTE — ED Notes (Signed)
Pt refused covid swab. °

## 2020-03-01 ENCOUNTER — Emergency Department (HOSPITAL_COMMUNITY)
Admission: EM | Admit: 2020-03-01 | Discharge: 2020-03-01 | Disposition: A | Discharge status: Home / Self Care | Payer: PRIVATE HEALTH INSURANCE | Attending: Emergency Medicine | Admitting: Emergency Medicine

## 2020-03-01 DIAGNOSIS — R21 Rash and other nonspecific skin eruption: Secondary | ICD-10-CM

## 2020-03-01 MED ORDER — PREDNISONE 20 MG PO TABS: 40.0000 mg | ORAL_TABLET | Freq: Every day | ORAL | 0 refills | Status: AC

## 2020-03-01 MED ORDER — CLOTRIMAZOLE-BETAMETHASONE 1-0.05 % EX CREA: TOPICAL_CREAM | CUTANEOUS | 0 refills | Status: DC

## 2020-03-01 NOTE — ED Provider Notes (Signed)
[facility] EMERGENCY DEPARTMENT Provider Note   CSN: [id number] Arrival date & time: 03/01/20  1155     History No chief complaint on file.   [facility] is a 24 y.o. male a past medical history of migraines.  Patient presents with a chief complaint of rash.  Patient states that he first noticed the rash last Monday.  Patient reports that the rash started in his left axilla, has grown in size, and spread to his right axilla and abdomen.  Patient reports that the rash is pruritic and complains of pain to his left axilla.  Patient also reports that he had drainage of a clear liquid without foul odor from his left axilla.  atient denies any fevers, chills, nausea, vomiting, diarrhea, shortness of breath, chest pain, penile lesions or penile discharge.  Patient denies using any new deodorants, soaps, detergents, purchasing new clothes and wearing them without washing, new pets, or working outdoors.  He denies similar episodes like this in the past.     HPI     No past medical history on file.  Patient Active Problem List   Diagnosis Date Noted  . Migraine 01/12/2016  . Urinary incontinence 07/14/2013  . PTSD (post-traumatic stress disorder) 07/14/2013  . INFLUENZA 01/13/2008  . CHEST PAIN 04/25/2007    No past surgical history on file.     No family history on file.  Social History   Tobacco Use  . Smoking status: Never Smoker  . Smokeless tobacco: Never Used  Substance Use Topics  . Alcohol use: No    Alcohol/week: 0.0 standard drinks  . Drug use: No    Home Medications Prior to Admission medications   Medication Sig Start Date End Date Taking? Authorizing Provider  clotrimazole-betamethasone (LOTRISONE) cream Apply to affected area 2 times daily prn 03/01/20   Haskel Schroeder, PA-C  escitalopram (LEXAPRO) 10 MG tablet TAKE 1 TABLET BY MOUTH EVERY DAY 08/21/18   Nelwyn Salisbury, MD  fluticasone [facility]) 50 MCG/ACT nasal spray Place 1 spray  into both nostrils daily for 5 days. 08/04/18 08/09/18  Claude Manges, PA-C  ibuprofen (ADVIL) 800 MG tablet Take 1 tablet (800 mg total) by mouth every 8 (eight) hours as needed. 04/14/19   Lawyer, Cristal Deer, PA-C  loperamide (IMODIUM) 2 MG capsule Take 1 capsule (2 mg total) by mouth 4 (four) times daily as needed for diarrhea or loose stools. 12/29/17   Lawyer, Cristal Deer, PA-C  predniSONE (DELTASONE) 20 MG tablet Take 2 tablets (40 mg total) by mouth daily with breakfast for 5 days. 03/01/20 03/06/20  Haskel Schroeder, PA-C  promethazine (PHENERGAN) 25 MG tablet Take 1 tablet (25 mg total) by mouth every 6 (six) hours as needed for nausea or vomiting. 12/29/17   Lawyer, Cristal Deer, PA-C  SUMAtriptan (IMITREX) 100 MG tablet Take 1 tablet (100 mg total) by mouth as needed for migraine. May repeat in 2 hours if headache persists or recurs. 06/21/19   Muthersbaugh, Dahlia Client, PA-C    Allergies    Patient has no known allergies.  Review of Systems   Review of Systems  Physical Exam Updated Vital Signs BP 119/60 (BP Location: Right Arm)   Pulse 63   Temp 98 F (36.7 C) (Oral)   Resp 18   SpO2 100%   Physical Exam Constitutional:      General: He is not in acute distress.    Appearance: He is not ill-appearing, toxic-appearing or diaphoretic.  HENT:  Head: Normocephalic.  Cardiovascular:     Rate and Rhythm: Normal rate and regular rhythm.     Heart sounds: Normal heart sounds.  Pulmonary:     Effort: Pulmonary effort is normal.     Breath sounds: Normal breath sounds.  Skin:    General: Skin is warm and dry.     Findings: Rash present. No petechiae. Rash is macular and papular. Rash is not crusting, nodular, purpuric, scaling or vesicular.     Comments: Area of erythema and papules located in right axilla, left axilla, right lower quadrant and left lower quadrant    Neurological:     General: No focal deficit present.     Mental Status: He is alert.  Psychiatric:         Behavior: Behavior is cooperative.                ED Results / Procedures / Treatments   Labs (all labs ordered are listed, but only abnormal results are displayed) Labs Reviewed - No data to display  EKG None  Radiology No results found.  Procedures Procedures (including critical care time)  Medications Ordered in ED Medications - No data to display  ED Course  I have reviewed the triage vital signs and the nursing notes.  Pertinent labs & imaging results that were available during my care of the patient were reviewed by me and considered in my medical decision making (see chart for details).    MDM Rules/Calculators/A&P                          Alert 24 year old male in no acute distress presents with a chief complaint of rash.  Patient reports he first noticed the rash last Monday.  Patient states the rash was originally located in his left axilla.  It has grown in size and spread to his right axilla, lower right and lower left abdomen.  Patient reports that rash is pruritic.  He also complains of pain to his left axilla and reports he has had some clear drainage without foul odor.  Patient denies any fevers, chills, nausea, vomiting, diarrhea, shortness of breath, chest pain, penile lesions or penile discharge.  Patient denies using any new deodorants, soaps, detergents, purchasing new clothes and wearing them without washing, new pets, or working outdoors.  He denies similar episodes like this in the past.    On physical exam patient was noted to have a papular rash to his left axilla, with a white appearance.  A small wound was noted in the patient's left axilla as well however no fluctuance or induration was appreciated.  Patient was also found to have this papular rash on his right axilla, lower right abdomen and lower left abdomen.  The rash looks inflammatory and possibly urticarial in nature.    Patient was given a prescription for oral prednisone as his rash  was located in multiple locations, he was also given clotrimazole-betamethasone cream to use on the left axilla due to the white appearance noted there.  Patient was on hyperpronation for local dermatologist and told to follow-up with them.  Told to follow-up with PCP, or urgent care if his symptoms did not improve.  Patient was given strict return precautions.  Patient expressed understanding of all instructions and all his questions were answered.     Final Clinical Impression(s) / ED Diagnoses Final diagnoses:  Rash and nonspecific skin eruption    Rx / DC Orders  ED Discharge Orders         Ordered    clotrimazole-betamethasone (LOTRISONE) cream        03/01/20 1420    predniSONE (DELTASONE) 20 MG tablet  Daily with breakfast        03/01/20 [address] 03/01/20 1911    Eber Hong, MD 03/02/20 1303

## 2020-03-01 NOTE — Discharge Instructions (Signed)
You came to the hospital today to have your rash evaluated.  Based on your history and physical exam you were prescribed oral steroids and a LOTRISONE cream.  Please take 2 Prednisone tablets at breakfast for 5 days.  Please apply the clotrimazole-betamethasone (LOTRISONE) cream to your right armpit as needed twice a day.    Listed below are local [facility] dermatologists. Please schedule an appointment with them if your rash does not improve. [facility] Dermatologists:  [facility]  3.2 [phone])  Dermatologist  [address] [place] # [place]  [phone]   Dr. Mertha Finders, MD  2.6 [phone])  Dermatologist  [address] [place]  [phone]  [facility]  3.5 (3)  [facility]  [address] [place]  [phone]   [facility]  4.0 (4)  Dermatologist  [address]  [phone]  Janalyn Harder MD  3.0 (2)  Dermatologist  [address]  [phone]  Hoyle Sauer  2.7 (6)  Dermatologist  [address] [place]  [phone]  [place] Amy Y MD  2.0 (1)  Dermatologist  [address] [place]  [phone]  [facility]  5.0 (3)  Doctor  [address]  [phone]    Get help right away if you: Have a fever and your symptoms suddenly get worse. Develop confusion. Have a severe headache or a stiff neck. Have severe joint pains or stiffness. Have a seizure. Develop a rash that covers all or most of your body. The rash may or may not be painful. Develop blisters that: Are on top of the rash. Grow larger or grow together. Are painful. Are inside your nose or mouth. Develop a rash that: Looks like purple pinprick-sized spots all over your body. Has a "bull's eye" or looks like a target. Is not related to sun exposure, is red and painful, and causes your skin to peel.

## 2020-03-01 NOTE — ED Triage Notes (Signed)
Pt here from home with c/o rash under his left arm and chest and abd itches and burns

## 2020-03-01 NOTE — ED Provider Notes (Signed)
Patient with a rash to his bilateral lower abdomen sides and bilateral axilla, appears somewhat papular or even urticarial in a large response, the left axilla has more of a white appearance, will give clotrimazole betamethasone topical to the left axilla, course of prednisone as it does look inflammatory, patient agreeable.  Nothing around the genitals, nothing on the face, nothing on the moist mucous membranes.  Medical screening examination/treatment/procedure(s) were conducted as a shared visit with non-physician practitioner(s) and myself.  I personally evaluated the patient during the encounter.  Clinical Impression:   Final diagnoses:  Rash and nonspecific skin eruption         Eber Hong, MD 03/02/20 1304

## 2020-03-08 ENCOUNTER — Encounter (HOSPITAL_COMMUNITY): Payer: Self-pay | Admitting: Emergency Medicine

## 2020-03-08 ENCOUNTER — Emergency Department (HOSPITAL_COMMUNITY)
Admission: EM | Admit: 2020-03-08 | Discharge: 2020-03-08 | Disposition: A | Discharge status: Home / Self Care | Payer: Self-pay | Attending: Emergency Medicine | Admitting: Emergency Medicine

## 2020-03-08 ENCOUNTER — Other Ambulatory Visit: Payer: Self-pay

## 2020-03-08 DIAGNOSIS — R519 Headache, unspecified: Secondary | ICD-10-CM | POA: Insufficient documentation

## 2020-03-08 MED ORDER — SUMATRIPTAN SUCCINATE 100 MG PO TABS
100.0000 mg | ORAL_TABLET | Freq: Once | ORAL | Status: AC
  Administered 2020-03-08: 100 mg via ORAL
  Filled 2020-03-08: qty 1

## 2020-03-08 MED ORDER — SUMATRIPTAN SUCCINATE 100 MG PO TABS: 100.0000 mg | ORAL_TABLET | ORAL | 0 refills | Status: DC | PRN

## 2020-03-08 MED ORDER — ACETAMINOPHEN 325 MG PO TABS
650.0000 mg | ORAL_TABLET | Freq: Once | ORAL | Status: AC
  Administered 2020-03-08: 650 mg via ORAL
  Filled 2020-03-08: qty 2

## 2020-03-08 NOTE — ED Triage Notes (Signed)
Pt reports left sided headache. Denies nausea, vomiting. Pt awake, alert, appropriate. VSS.

## 2020-03-08 NOTE — Discharge Instructions (Addendum)
Take ibuprofen 600 mg every 6 hours as needed for pain. Take Imitrex as needed for headache not relieved with ibuprofen.  Return to the emergency department if you develop worsening headache, high fever, or other new and concerning symptoms.

## 2020-03-08 NOTE — ED Provider Notes (Signed)
[facility] EMERGENCY DEPARTMENT Provider Note   CSN: [id number] Arrival date & time: 03/08/20  0208     History No chief complaint on file.   Erik Yang is a 24 y.o. male.  Patient is a 24 year old male with history of migraines presenting with complaints of headache. Patient states his headache began earlier this afternoon. It is mainly left-sided and he describes it as a pressure in his head. He denies any visual disturbances. He denies any fevers, chills, or stiff neck. He denies any injury or trauma. He tried taking ibuprofen with little relief.  The history is provided by the patient.       History reviewed. No pertinent past medical history.  Patient Active Problem List   Diagnosis Date Noted  . Migraine 01/12/2016  . Urinary incontinence 07/14/2013  . PTSD (post-traumatic stress disorder) 07/14/2013  . INFLUENZA 01/13/2008  . CHEST PAIN 04/25/2007    History reviewed. No pertinent surgical history.     History reviewed. No pertinent family history.  Social History   Tobacco Use  . Smoking status: Never Smoker  . Smokeless tobacco: Never Used  Substance Use Topics  . Alcohol use: No    Alcohol/week: 0.0 standard drinks  . Drug use: No    Home Medications Prior to Admission medications   Medication Sig Start Date End Date Taking? Authorizing Provider  clotrimazole-betamethasone (LOTRISONE) cream Apply to affected area 2 times daily prn Patient taking differently: Apply 1 application topically 2 (two) times daily as needed (rash). 03/01/20  Yes Haskel Schroeder, PA-C  fluticasone (FLONASE) 50 MCG/ACT nasal spray Place 1 spray into both nostrils daily for 5 days. Patient taking differently: Place 1 spray into both nostrils daily as needed for allergies. 08/04/18 08/09/18 Yes Soto, Johana, PA-C  ibuprofen (ADVIL) 800 MG tablet Take 1 tablet (800 mg total) by mouth every 8 (eight) hours as needed. Patient taking differently: Take 800  mg by mouth every 8 (eight) hours as needed for headache or mild pain. 04/14/19  Yes Lawyer, Cristal Deer, PA-C  loperamide (IMODIUM) 2 MG capsule Take 1 capsule (2 mg total) by mouth 4 (four) times daily as needed for diarrhea or loose stools. 12/29/17  Yes Lawyer, Cristal Deer, PA-C  promethazine (PHENERGAN) 25 MG tablet Take 1 tablet (25 mg total) by mouth every 6 (six) hours as needed for nausea or vomiting. 12/29/17  Yes Lawyer, Cristal Deer, PA-C  SUMAtriptan (IMITREX) 100 MG tablet Take 1 tablet (100 mg total) by mouth as needed for migraine. May repeat in 2 hours if headache persists or recurs. 06/21/19  Yes Muthersbaugh, Dahlia Client, PA-C  escitalopram (LEXAPRO) 10 MG tablet TAKE 1 TABLET BY MOUTH EVERY DAY Patient not taking: Reported on 03/08/2020 08/21/18   Nelwyn [place]isbury, MD    Allergies    Patient has no known allergies.  Review of Systems   Review of Systems  All other systems reviewed and are negative.   Physical Exam Updated Vital Signs BP 117/79   Pulse (!) 50   Temp 98.4 F (36.9 C) (Oral)   Resp 15   Ht 5\' 10"  (1.778 m)   Wt 63.5 kg   SpO2 99%   BMI 20.09 kg/m   Physical Exam Vitals and nursing note reviewed.  Constitutional:      General: He is not in acute distress.    Appearance: He is well-developed and well-nourished. He is not diaphoretic.  HENT:     Head: Normocephalic and atraumatic.  Mouth/Throat:     Mouth: Oropharynx is clear and moist.  Eyes:     Extraocular Movements: Extraocular movements intact.     Pupils: Pupils are equal, round, and reactive to light.  Cardiovascular:     Rate and Rhythm: Normal rate and regular rhythm.     Heart sounds: No murmur heard. No friction rub.  Pulmonary:     Effort: Pulmonary effort is normal. No respiratory distress.     Breath sounds: Normal breath sounds. No wheezing or rales.  Abdominal:     General: Bowel sounds are normal. There is no distension.     Palpations: Abdomen is soft.     Tenderness: There is  no abdominal tenderness.  Musculoskeletal:        General: No edema. Normal range of motion.     Cervical back: Normal range of motion and neck supple. No rigidity or tenderness.  Skin:    General: Skin is warm and dry.  Neurological:     General: No focal deficit present.     Mental Status: He is alert and oriented to person, place, and time.     Cranial Nerves: No cranial nerve deficit.     Motor: No weakness.     Coordination: Coordination normal.     ED Results / Procedures / Treatments   Labs (all labs ordered are listed, but only abnormal results are displayed) Labs Reviewed - No data to display  EKG None  Radiology No results found.  Procedures Procedures (including critical care time)  Medications Ordered in ED Medications  acetaminophen (TYLENOL) tablet 650 mg (650 mg Oral Given 03/08/20 0353)  SUMAtriptan (IMITREX) tablet 100 mg (100 mg Oral Given 03/08/20 0973)    ED Course  I have reviewed the triage vital signs and the nursing notes.  Pertinent labs & imaging results that were available during my care of the patient were reviewed by me and considered in my medical decision making (see chart for details).    MDM Rules/Calculators/A&P  Patient presenting here with complaints of headache. His vitals are stable, he is afebrile, and neurologic exam is nonfocal. Patient offered migraine cocktail and fluids, however has declined requesting a pill. He was given Imitrex with good results.  At this point, patient appears appropriate for discharge. There are no red flags that would suggest an emergent situation. He will be discharged with Imitrex and as needed return.  Final Clinical Impression(s) / ED Diagnoses Final diagnoses:  None    Rx / DC Orders ED Discharge Orders    None       Geoffery Lyons, MD 03/08/20 [phone]

## 2020-07-18 ENCOUNTER — Emergency Department (HOSPITAL_COMMUNITY): Payer: Self-pay

## 2020-07-18 ENCOUNTER — Encounter (HOSPITAL_COMMUNITY): Payer: Self-pay | Admitting: Emergency Medicine

## 2020-07-18 ENCOUNTER — Emergency Department (HOSPITAL_COMMUNITY)
Admission: EM | Admit: 2020-07-18 | Discharge: 2020-07-18 | Disposition: A | Discharge status: Home / Self Care | Payer: Self-pay | Attending: Emergency Medicine | Admitting: Emergency Medicine

## 2020-07-18 ENCOUNTER — Other Ambulatory Visit: Payer: Self-pay

## 2020-07-18 DIAGNOSIS — S43006A Unspecified dislocation of unspecified shoulder joint, initial encounter: Secondary | ICD-10-CM

## 2020-07-18 DIAGNOSIS — S43014A Anterior dislocation of right humerus, initial encounter: Secondary | ICD-10-CM | POA: Insufficient documentation

## 2020-07-18 DIAGNOSIS — W19XXXA Unspecified fall, initial encounter: Secondary | ICD-10-CM | POA: Insufficient documentation

## 2020-07-18 DIAGNOSIS — Y9367 Activity, basketball: Secondary | ICD-10-CM | POA: Insufficient documentation

## 2020-07-18 MED ORDER — HYDROMORPHONE HCL 1 MG/ML IJ SOLN
1.0000 mg | Freq: Once | INTRAMUSCULAR | Status: AC
  Administered 2020-07-18: 1 mg via INTRAVENOUS
  Filled 2020-07-18: qty 1

## 2020-07-18 MED ORDER — PROPOFOL 10 MG/ML IV BOLUS
INTRAVENOUS | Status: AC | PRN
  Administered 2020-07-18: 20 mg via INTRAVENOUS

## 2020-07-18 MED ORDER — ACETAMINOPHEN 500 MG PO TABS
1000.0000 mg | ORAL_TABLET | Freq: Once | ORAL | Status: DC
  Filled 2020-07-18: qty 2

## 2020-07-18 MED ORDER — PROPOFOL 10 MG/ML IV BOLUS
0.5000 mg/kg | Freq: Once | INTRAVENOUS | Status: AC
  Administered 2020-07-18: 30 mg via INTRAVENOUS
  Filled 2020-07-18: qty 20

## 2020-07-18 NOTE — Discharge Instructions (Addendum)
Contact a health care provider if: °Your brace or sling gets damaged. °Get help right away if: °Your pain gets worse rather than better. °You lose feeling in your arm or hand. °Your arm or hand becomes white and cold. °

## 2020-07-18 NOTE — ED Provider Notes (Signed)
.  Sedation  Date/Time: 07/18/2020 9:57 PM Performed by: Tilden Fossa, MD Authorized by: Tilden Fossa, MD   Consent:    Consent obtained:  Verbal   Consent given by:  Patient   Risks discussed:  Dysrhythmia, respiratory compromise necessitating ventilatory assistance and intubation, vomiting, nausea, prolonged sedation necessitating reversal and inadequate sedation Universal protocol:    Immediately prior to procedure, a time out was called: yes     Patient identity confirmed:  Verbally with patient Indications:    Procedure performed:  Dislocation reduction Pre-sedation assessment:    Time since last food or drink:  5   ASA classification: class 1 - normal, healthy patient     Mallampati score:  I - soft palate, uvula, fauces, pillars visible   Pre-sedation assessments completed and reviewed: airway patency, cardiovascular function, hydration status, mental status, nausea/vomiting, pain level and respiratory function   Immediate pre-procedure details:    Reviewed: vital signs     Verified: bag valve mask available, intubation equipment available, oxygen available and suction available   Procedure details (see MAR for exact dosages):    Preoxygenation:  Nasal cannula   Sedation:  Propofol   Intended level of sedation: deep   Analgesia:  Hydromorphone   Intra-procedure monitoring:  Blood pressure monitoring, cardiac monitor, continuous capnometry, continuous pulse oximetry, frequent LOC assessments and frequent vital sign checks   Total Provider sedation time (minutes):  10      Tilden Fossa, MD 07/18/20 2303

## 2020-07-18 NOTE — ED Triage Notes (Signed)
Pt fell when playing basketball and hurt his right arm. Pt states the pain is 10/10. Pt has palpable distal pulses and is able to move his fingers.

## 2020-07-18 NOTE — ED Provider Notes (Signed)
[facility]-EMERGENCY DEPT Provider Note   CSN: [id number] Arrival date & time: 07/18/20  2004     History Chief Complaint  Patient presents with  . Arm Injury    Erik Yang is a 25 y.o. male who presents emergency department chief complaint of right arm pain.  Rates pain as 10 out of 10.  This occurred this evening while he was playing basketball.  Denies any numbness or tingling in the fingers.  No history of the same.  HPI     History reviewed. No pertinent past medical history.  Patient Active Problem List   Diagnosis Date Noted  . Migraine 01/12/2016  . Urinary incontinence 07/14/2013  . PTSD (post-traumatic stress disorder) 07/14/2013  . INFLUENZA 01/13/2008  . CHEST PAIN 04/25/2007    History reviewed. No pertinent surgical history.     History reviewed. No pertinent family history.  Social History   Tobacco Use  . Smoking status: Never Smoker  . Smokeless tobacco: Never Used  Substance Use Topics  . Alcohol use: No    Alcohol/week: 0.0 standard drinks  . Drug use: No    Home Medications Prior to Admission medications   Medication Sig Start Date End Date Taking? Authorizing Provider  clotrimazole-betamethasone (LOTRISONE) cream Apply to affected area 2 times daily prn Patient taking differently: Apply 1 application topically 2 (two) times daily as needed (rash). 03/01/20   Haskel Schroeder, PA-C  escitalopram (LEXAPRO) 10 MG tablet TAKE 1 TABLET BY MOUTH EVERY DAY Patient not taking: Reported on 03/08/2020 08/21/18   Nelwyn Salisbury, MD  fluticasone [facility]) 50 MCG/ACT nasal spray Place 1 spray into both nostrils daily for 5 days. Patient taking differently: Place 1 spray into both nostrils daily as needed for allergies. 08/04/18 08/09/18  Claude Manges, PA-C  ibuprofen (ADVIL) 800 MG tablet Take 1 tablet (800 mg total) by mouth every 8 (eight) hours as needed. Patient taking differently: Take 800 mg by mouth every 8 (eight)  hours as needed for headache or mild pain. 04/14/19   Lawyer, Cristal Deer, PA-C  loperamide (IMODIUM) 2 MG capsule Take 1 capsule (2 mg total) by mouth 4 (four) times daily as needed for diarrhea or loose stools. 12/29/17   Lawyer, Cristal Deer, PA-C  promethazine (PHENERGAN) 25 MG tablet Take 1 tablet (25 mg total) by mouth every 6 (six) hours as needed for nausea or vomiting. 12/29/17   Lawyer, Cristal Deer, PA-C  SUMAtriptan (IMITREX) 100 MG tablet Take 1 tablet (100 mg total) by mouth every 2 (two) hours as needed for migraine. May repeat in 2 hours if headache persists or recurs. 03/08/20   Geoffery Lyons, MD    Allergies    Patient has no known allergies.  Review of Systems   Review of Systems Ten systems reviewed and are negative for acute change, except as noted in the HPI.   Physical Exam Updated Vital Signs BP (!) 137/92   Pulse 67   Temp 97.7 F (36.5 C) (Oral)   Resp 16   Ht 5\' 10"  (1.778 m)   Wt 63.5 kg   SpO2 100%   BMI 20.09 kg/m   Physical Exam Vitals and nursing note reviewed.  Constitutional:      General: He is not in acute distress.    Appearance: He is well-developed. He is not diaphoretic.  HENT:     Head: Normocephalic and atraumatic.  Eyes:     General: No scleral icterus.    Extraocular Movements: Extraocular movements intact.  Conjunctiva/sclera: Conjunctivae normal.     Pupils: Pupils are equal, round, and reactive to light.  Cardiovascular:     Rate and Rhythm: Normal rate and regular rhythm.     Heart sounds: Normal heart sounds.  Pulmonary:     Effort: Pulmonary effort is normal. No respiratory distress.     Breath sounds: Normal breath sounds.  Abdominal:     Palpations: Abdomen is soft.     Tenderness: There is no abdominal tenderness.  Musculoskeletal:     Cervical back: Normal range of motion and neck supple.     Comments: R shoulder with + sulcus sign  Skin:    General: Skin is warm and dry.  Neurological:     Mental Status: He is  alert.  Psychiatric:        Behavior: Behavior normal.     ED Results / Procedures / Treatments   Labs (all labs ordered are listed, but only abnormal results are displayed) Labs Reviewed - No data to display  EKG None  Radiology DG Humerus Right  Result Date: 07/18/2020 CLINICAL DATA:  Fall while playing basketball. EXAM: RIGHT HUMERUS - 2+ VIEW COMPARISON:  None. FINDINGS: Anterior dislocation at the right glenohumeral joint. No visible fracture. IMPRESSION: Anterior right glenohumeral joint dislocation. Electronically Signed   By: Deatra Robinson M.D.   On: 07/18/2020 21:02    Procedures Reduction of dislocation  Date/Time: 07/18/2020 10:11 PM Performed by: Arthor Captain, PA-C Authorized by: Arthor Captain, PA-C  Consent: Verbal consent obtained. Consent given by: patient Patient understanding: patient states understanding of the procedure being performed Site marked: the operative site was marked Patient identity confirmed: verbally with patient and arm band Time out: Immediately prior to procedure a "time out" was called to verify the correct patient, procedure, equipment, support staff and site/side marked as required. Preparation: Patient was prepped and draped in the usual sterile fashion. Local anesthesia used: no  Anesthesia: Local anesthesia used: no  Sedation: Patient sedated: see note by Dr. Madilyn Hook.  Patient tolerance: patient tolerated the procedure well with no immediate complications      Medications Ordered in ED Medications  propofol (DIPRIVAN) 10 mg/mL bolus/IV push (20 mg Intravenous Given 07/18/20 2150)  HYDROmorphone (DILAUDID) injection 1 mg (1 mg Intravenous Given 07/18/20 2120)  propofol (DIPRIVAN) 10 mg/mL bolus/IV push 31.8 mg (30 mg Intravenous Given by Other 07/18/20 2149)    ED Course  I have reviewed the triage vital signs and the nursing notes.  Pertinent labs & imaging results that were available during my care of the patient were  reviewed by me and considered in my medical decision making (see chart for details).    MDM Rules/Calculators/A&P                          Here with right shoulder dislocation.  The postreduction film shows duction of the of the right humerus.  Patient is neurovascularly intact.  Placed in sling immobilizer.  Follow-up with orthopedics.  Discussed outpatient care and return precautions. Final Clinical Impression(s) / ED Diagnoses Final diagnoses:  Dislocation closed, shoulder    Rx / DC Orders ED Discharge Orders    None       Arthor Captain, PA-C 07/18/20 2231    Tilden Fossa, MD 07/20/20 [phone]

## 2021-01-16 ENCOUNTER — Other Ambulatory Visit: Payer: Self-pay

## 2021-01-16 ENCOUNTER — Emergency Department (HOSPITAL_COMMUNITY)
Admission: EM | Admit: 2021-01-16 | Discharge: 2021-01-16 | Disposition: A | Discharge status: Home / Self Care | Payer: Self-pay | Attending: Emergency Medicine | Admitting: Emergency Medicine

## 2021-01-16 ENCOUNTER — Emergency Department (HOSPITAL_COMMUNITY): Payer: Self-pay

## 2021-01-16 DIAGNOSIS — S43004A Unspecified dislocation of right shoulder joint, initial encounter: Secondary | ICD-10-CM | POA: Insufficient documentation

## 2021-01-16 DIAGNOSIS — X58XXXA Exposure to other specified factors, initial encounter: Secondary | ICD-10-CM | POA: Insufficient documentation

## 2021-01-16 MED ORDER — MORPHINE SULFATE (PF) 4 MG/ML IV SOLN
4.0000 mg | Freq: Once | INTRAVENOUS | Status: AC
Start: 2021-01-16 — End: 2021-01-16
  Administered 2021-01-16: 4 mg via INTRAVENOUS
  Filled 2021-01-16: qty 1

## 2021-01-16 MED ORDER — ONDANSETRON HCL 4 MG/2ML IJ SOLN
4.0000 mg | Freq: Once | INTRAMUSCULAR | Status: AC
  Administered 2021-01-16: 4 mg via INTRAVENOUS
  Filled 2021-01-16: qty 2

## 2021-01-16 MED ORDER — IBUPROFEN 200 MG PO TABS
600.0000 mg | ORAL_TABLET | Freq: Once | ORAL | Status: AC
  Administered 2021-01-16: 600 mg via ORAL
  Filled 2021-01-16: qty 3

## 2021-01-16 NOTE — Discharge Instructions (Addendum)
As we discussed today after 1 to 2 days you may take the sling off and while keeping your arm close to your body start doing small circles with the entire arm.  You may take the sling off before then however it is very important that you keep your elbow and wrist close to your body to reduce your risk of dislocating again.  I would recommend not driving until cleared to do so by orthopedics.  Please do not lift your arm above your shoulder head until cleared to do so by orthopedics.  Please take Ibuprofen (Advil, motrin) and Tylenol (acetaminophen) to relieve your pain.    You may take up to 600 MG (3 pills) of normal strength ibuprofen every 8 hours as needed.   You make take tylenol, up to 1,000 mg (two extra strength pills) every 8 hours as needed.   It is safe to take ibuprofen and tylenol at the same time as they work differently.   Do not take more than 3,000 mg tylenol in a 24 hour period (not more than one dose every 8 hours.  Please check all medication labels as many medications such as pain and cold medications may contain tylenol.  Do not drink alcohol while taking these medications.  Do not take other NSAID'S while taking ibuprofen (such as aleve or naproxen).  Please take ibuprofen with food to decrease stomach upset.  Today you received medications that may make you sleepy or impair your ability to make decisions.  For the next 24 hours please do not drive, operate heavy machinery, care for a small child with out another adult present, or perform any activities that may cause harm to you or someone else if you were to fall asleep or be impaired.

## 2021-01-16 NOTE — ED Notes (Signed)
Pt given sandwich and apple juice at this time for PO/fluid challenge.

## 2021-01-16 NOTE — ED Triage Notes (Signed)
Pt reports he possibly dislocated his right shoulder while playing with his daughter on the playground. VSS.

## 2021-01-16 NOTE — ED Provider Notes (Signed)
[facility] Provider Note   CSN: [id number] Arrival date & time: 01/16/21  1740     History Chief Complaint  Patient presents with   Shoulder Injury    Erik Yang is a 25 y.o. male who presents today for evaluation of a right shoulder injury.  He states that he was playing with his daughter on the playground and getting ready to go down a slide.  He was holding onto a roof like structure over the top of the slide and states he did not let go soon enough causing his right shoulder to twist back and pop.  He says this feels like when he dislocated it in April.  He denies any numbness or weakness.  He has been unable to move his shoulder due to pain since.    He states he has dislocated the shoulder once before.  HPI     No past medical history on file.  Patient Active Problem List   Diagnosis Date Noted   Migraine 01/12/2016   Urinary incontinence 07/14/2013   PTSD (post-traumatic stress disorder) 07/14/2013   INFLUENZA 01/13/2008   CHEST PAIN 04/25/2007    No past surgical history on file.     No family history on file.  Social History   Tobacco Use   Smoking status: Never   Smokeless tobacco: Never  Substance Use Topics   Alcohol use: No    Alcohol/week: 0.0 standard drinks   Drug use: No    Home Medications Prior to Admission medications   Medication Sig Start Date End Date Taking? Authorizing Provider  clotrimazole-betamethasone (LOTRISONE) cream Apply to affected area 2 times daily prn Patient taking differently: Apply 1 application topically 2 (two) times daily as needed (rash). 03/01/20   Haskel Schroeder, PA-C  escitalopram (LEXAPRO) 10 MG tablet TAKE 1 TABLET BY MOUTH EVERY DAY Patient not taking: Reported on 03/08/2020 08/21/18   Nelwyn Salisbury, MD  fluticasone [facility]) 50 MCG/ACT nasal spray Place 1 spray into both nostrils daily for 5 days. Patient taking differently: Place 1 spray into both nostrils daily  as needed for allergies. 08/04/18 08/09/18  Claude Manges, PA-C  ibuprofen (ADVIL) 800 MG tablet Take 1 tablet (800 mg total) by mouth every 8 (eight) hours as needed. Patient taking differently: Take 800 mg by mouth every 8 (eight) hours as needed for headache or mild pain. 04/14/19   Lawyer, Cristal Deer, PA-C  loperamide (IMODIUM) 2 MG capsule Take 1 capsule (2 mg total) by mouth 4 (four) times daily as needed for diarrhea or loose stools. 12/29/17   Lawyer, Cristal Deer, PA-C  promethazine (PHENERGAN) 25 MG tablet Take 1 tablet (25 mg total) by mouth every 6 (six) hours as needed for nausea or vomiting. 12/29/17   Lawyer, Cristal Deer, PA-C  SUMAtriptan (IMITREX) 100 MG tablet Take 1 tablet (100 mg total) by mouth every 2 (two) hours as needed for migraine. May repeat in 2 hours if headache persists or recurs. 03/08/20   Geoffery Lyons, MD    Allergies    Patient has no known allergies.  Review of Systems   Review of Systems  Constitutional:  Negative for chills and fever.  HENT:  Negative for congestion.   Respiratory:  Negative for chest tightness and shortness of breath.   Cardiovascular:  Negative for chest pain.  Gastrointestinal:  Negative for abdominal pain.  Musculoskeletal:  Negative for back pain.       Right shoulder pain  Skin:  Negative for color  change and wound.  Neurological:  Negative for weakness and headaches.  All other systems reviewed and are negative.  Physical Exam Updated Vital Signs BP 111/66 (BP Location: Left Arm)   Pulse 61   Temp 98.3 F (36.8 C) (Oral)   Resp 18   SpO2 100%   Physical Exam Vitals and nursing note reviewed.  Constitutional:      General: He is not in acute distress.    Appearance: He is not diaphoretic.  HENT:     Head: Normocephalic and atraumatic.  Eyes:     General: No scleral icterus.       Right eye: No discharge.        Left eye: No discharge.     Conjunctiva/sclera: Conjunctivae normal.  Cardiovascular:     Rate and Rhythm:  Normal rate and regular rhythm.  Pulmonary:     Effort: Pulmonary effort is normal. No respiratory distress.     Breath sounds: No stridor.  Abdominal:     General: There is no distension.  Musculoskeletal:     Cervical back: Normal range of motion.     Comments: Right shoulder has obvious deformity, is squared off when compared to left with a sulcus consistent with dislocation.  5/5 right grip strength.  Pain with movements of the right shoulder.  Skin:    General: Skin is warm and dry.  Neurological:     Mental Status: He is alert.     Motor: No abnormal muscle tone.     Comments: Sensation intact to light touch to right hand.  Psychiatric:        Mood and Affect: Mood normal.        Behavior: Behavior normal.    ED Results / Procedures / Treatments   Labs (all labs ordered are listed, but only abnormal results are displayed) Labs Reviewed - No data to display  EKG None  Radiology DG Shoulder Right  Result Date: 01/16/2021 CLINICAL DATA:  Shoulder injury.  Status post reduction. EXAM: RIGHT SHOULDER - 2+ VIEW COMPARISON:  07/18/2020 FINDINGS: There is no evidence of fracture or dislocation. There is no evidence of arthropathy or other focal bone abnormality. Soft tissues are unremarkable. IMPRESSION: Negative. Electronically Signed   By: Norva Pavlov M.D.   On: 01/16/2021 19:02    Procedures .Ortho Injury Treatment  Date/Time: 01/16/2021 7:00 PM Performed by: Cristina Gong, PA-C Authorized by: Cristina Gong, PA-C   Consent:    Consent obtained:  Verbal   Consent given by:  Patient   Risks discussed:  Fracture, irreducible dislocation, nerve damage, recurrent dislocation, restricted joint movement, stiffness and vascular damage (Inability to reduce, pain, need for repeat procedure or to progress to sedation)   Alternatives discussed:  No treatment and alternative treatment (Premanipulation x-rays, Sedation, intraarticular injection)Injury location:  shoulder Location details: right shoulder Injury type: dislocation Chronicity: recurrent Pre-procedure neurovascular assessment: neurovascularly intact Pre-procedure distal perfusion: normal Pre-procedure neurological function: normal Pre-procedure range of motion: reduced  Anesthesia: Local anesthesia used: no  Patient sedated: NoManipulation performed: yes Reduction method: external rotation Reduction successful: yes X-ray confirmed reduction: yes Immobilization: sling (With shoulder immobilization) Splint Applied by: ED Provider Post-procedure neurovascular assessment: post-procedure neurovascularly intact Post-procedure distal perfusion: normal Post-procedure neurological function: normal Post-procedure range of motion: improved Comments: Patient tolerated procedure very well without significant pain.  He was able to be coached through relaxing.  Patient and I discussed option for preprocedure x-rays, intra-articular lidocaine injection, sedation,.  We discussed risks, benefits,  and alternatives to these.  Given that this is a recurrent issue and clinically he appears to dislocate it without a significant trauma mechanism where I would have a high suspicion for fracture offered reduction without preprocedure x-rays which patient is in agreement with.  He was made aware that the option for sedation and other treatments was a possibility and that he could stop this attempt at any point.     Medications Ordered in ED Medications  ondansetron (ZOFRAN) injection 4 mg (4 mg Intravenous Given 01/16/21 1818)  morphine 4 MG/ML injection 4 mg (4 mg Intravenous Given 01/16/21 1820)  ibuprofen (ADVIL) tablet 600 mg (600 mg Oral Given 01/16/21 1953)    ED Course  I have reviewed the triage vital signs and the nursing notes.  Pertinent labs & imaging results that were available during my care of the patient were reviewed by me and considered in my medical decision making (see chart for  details).  Clinical Course as of 01/17/21 0040  Wynelle Link Jan 16, 2021  2036 Patient reevaluated, his pain is currently a 0 out of 10.  We discussed sling use, dislocation precautions and the need for follow-up and he states his understanding. [EH]    Clinical Course User Index [EH] Norman Clay   MDM Rules/Calculators/A&P                          Patient is a healthy 25 year old man who presents today for evaluation of a shoulder injury.  This occurred when he was holding onto a roof above a slide and went down the slide.  He did not fall onto the shoulder or have a mechanism that would be suggestive of a fracture. On initial exam he is neurovascularly intact, based on his history and shoulder deformity along with him reporting this feels like his prior dislocation we discussed options.  We discussed obtaining x-rays prior to any attempts at reduction, however given the mechanism a fracture does not sound likely and with clinical evidence of dislocation would not appear to change management, and patient declined x-ray prior to reduction. We discussed options for reduction including intra-articular lidocaine injection, IV medications for pain, and sedation.  We discussed risks, benefits, and alternatives and patient made the informed decision for IV pain medication and attempt at reduction without sedation. Patient was given Zofran and morphine, after which his shoulder was able to be reduced without significant difficulty.  He tolerated this extremely well.    Postreduction x-rays were obtained showing no fracture or residual dislocation. He was placed in a sling and swathe.  Recommended outpatient orthopedics follow-up.  He was given 600 mg of ibuprofen while in the emergency room.  Prior to discharge his pain was a 0 out of 10.  We discussed options for narcotic pain medicine pain however given that his pain is 0 currently after the ibuprofen will defer narcotic pain medicine.   Recommended ibuprofen and Tylenol.  We discussed ways to attempt to reduce the risk of recurrent dislocation and he states his understanding.  Return precautions were discussed with patient who states their understanding.  At the time of discharge patient denied any unaddressed complaints or concerns.  Patient is agreeable for discharge home.  Note: Portions of this report may have been transcribed using voice recognition software. Every effort was made to ensure accuracy; however, inadvertent computerized transcription errors may be present   Final Clinical Impression(s) / ED Diagnoses Final diagnoses:  Dislocation of right shoulder joint, initial encounter    Rx / DC Orders ED Discharge Orders     None        Norman Clay 01/17/21 0040    Charlynne Pander, MD 01/18/21 1500

## 2021-01-20 ENCOUNTER — Ambulatory Visit (INDEPENDENT_AMBULATORY_CARE_PROVIDER_SITE_OTHER): Payer: Self-pay | Admitting: Physician Assistant

## 2021-01-20 ENCOUNTER — Encounter: Payer: Self-pay | Admitting: Physician Assistant

## 2021-01-20 DIAGNOSIS — S43004S Unspecified dislocation of right shoulder joint, sequela: Secondary | ICD-10-CM

## 2021-01-20 NOTE — Progress Notes (Signed)
Office Visit Note   Patient: Erik Yang           Date of Birth: 1995/12/28           MRN: [id number] Visit Date: 01/20/2021              Requested by: Nelwyn Salisbury, MD [address] [place],  [place] [address] PCP: Nelwyn Salisbury, MD   Assessment & Plan: Visit Diagnoses:  1. Shoulder dislocation, right, sequela     Plan: Given the fact that this is patient's second dislocation in less than 7 months would recommend MRI arthrogram of the right shoulder rule out labral tear.  Also this most recent dislocation occurred with just simple reaching overhead.  He will continue the sling until he is 2 weeks status post injury.  After that he can come out of the sling for gentle range of motion but no extreme overhead activity external or internal rotation.  Questions were encouraged and answered at length.  We will see him back after the MRI arthrogram to go over the results and discuss further treatment.  Questions were encouraged and answered  Follow-Up Instructions: Return After MRI.   Orders:  No orders of the defined types were placed in this encounter.  No orders of the defined types were placed in this encounter.     Procedures: No procedures performed   Clinical Data: No additional findings.   Subjective: Chief Complaint  Patient presents with   Right Shoulder - Pain    HPI Erik Yang is a 25 year old male comes in today with right shoulder dislocation seen in the ER on 01/16/2021.  Reviewed notes from the ER on 01/16/2021 that was in August deformity of the shoulder and there were no films taken in prior to reduction.  Patient had a similar dislocation while playing basketball earlier this year in April there are films that show an anterior dislocation of the shoulder at that time.  He states that he is the only 2 times that the shoulder came out however the last injury was just reaching above his head to his stabilize himself before he went down the slide with  his daughter. He was given a sling which he presents today with.  He has had no additional sensation of the shoulder popping out.  He has had no physical therapy no treatment in the past for her shoulder.   Review of Systems See HPI  Objective: Vital Signs: There were no vitals taken for this visit.  Physical Exam General: Well-developed well-nourished male no acute distress mood affect appropriate. Psych: Alert and oriented x3 Ortho Exam Bilateral shoulders 5 5 strength of external/internal rotation against resistance.  Apprehension test causes discomfort on the right.  Right shoulder fluid range of motion no extreme external and internal rotation performed today.  Sensation intact bilateral hands light touch. Specialty Comments:  No specialty comments available.  Imaging: No results found.   PMFS History: Patient Active Problem List   Diagnosis Date Noted   Migraine 01/12/2016   Urinary incontinence 07/14/2013   PTSD (post-traumatic stress disorder) 07/14/2013   INFLUENZA 01/13/2008   CHEST PAIN 04/25/2007   History reviewed. No pertinent past medical history.  History reviewed. No pertinent family history.  History reviewed. No pertinent surgical history. Social History   Occupational History   Not on file  Tobacco Use   Smoking status: Never   Smokeless tobacco: Never  Substance and Sexual Activity   Alcohol use: No  Alcohol/week: 0.0 standard drinks   Drug use: No   Sexual activity: Not on file

## 2021-01-21 NOTE — Addendum Note (Signed)
Addended by: Fredda Hammed L on: 01/21/2021 11:15 AM   Modules accepted: Orders

## 2021-02-04 ENCOUNTER — Encounter: Payer: Self-pay | Admitting: *Deleted

## 2021-08-30 ENCOUNTER — Telehealth: Payer: Self-pay

## 2021-08-30 NOTE — Telephone Encounter (Signed)
Pt notified of last appt date. He wants to remain a patient of Dr Claris Che. CPE scheduled for 09/05/21 at 1p

## 2021-09-05 ENCOUNTER — Encounter: Payer: Self-pay | Admitting: Family Medicine

## 2021-09-05 ENCOUNTER — Ambulatory Visit (INDEPENDENT_AMBULATORY_CARE_PROVIDER_SITE_OTHER): Payer: Self-pay | Admitting: Family Medicine

## 2021-09-05 VITALS — BP 100/60 | HR 82 | Temp 98.9°F | Ht 68.5 in | Wt 149.0 lb

## 2021-09-05 DIAGNOSIS — Z0000 Encounter for general adult medical examination without abnormal findings: Secondary | ICD-10-CM

## 2021-09-05 NOTE — Progress Notes (Signed)
Subjective:    Patient ID: Erik Yang, male    DOB: 09-23-1995, 26 y.o.   MRN: [id number]  HPI Here for a well exam. He has no concerns. He is seeing Dr. 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 He will be getting an MRI scan soon to determine if he would be a candidate for a surgical repair.    Review of Systems  Constitutional: Negative.   HENT: Negative.    Eyes: Negative.   Respiratory: Negative.    Cardiovascular: Negative.   Gastrointestinal: Negative.   Genitourinary: Negative.   Musculoskeletal: Negative.   Skin: Negative.   Neurological: Negative.   Psychiatric/Behavioral: Negative.         Objective:   Physical Exam Constitutional:      General: He is not in acute distress.    Appearance: Normal appearance. He is well-developed. He is not diaphoretic.  HENT:     Head: Normocephalic and atraumatic.     Right Ear: External ear normal.     Left Ear: External ear normal.     Nose: Nose normal.     Mouth/Throat:     Pharynx: No oropharyngeal exudate.  Eyes:     General: No scleral icterus.       Right eye: No discharge.        Left eye: No discharge.     Conjunctiva/sclera: Conjunctivae normal.     Pupils: Pupils are equal, round, and reactive to light.  Neck:     Thyroid: No thyromegaly.     Vascular: No JVD.     Trachea: No tracheal deviation.  Cardiovascular:     Rate and Rhythm: Normal rate and regular rhythm.     Heart sounds: Normal heart sounds. No murmur heard.    No friction rub. No gallop.  Pulmonary:     Effort: Pulmonary effort is normal. No respiratory distress.     Breath sounds: Normal breath sounds. No wheezing or rales.  Chest:     Chest wall: No tenderness.  Abdominal:     General: Bowel sounds are normal. There is no distension.     Palpations: Abdomen is soft. There is no mass.     Tenderness: There is no abdominal tenderness. There is no guarding or rebound.   Genitourinary:    Penis: No tenderness.      Comments: He declined a pelvic exam Musculoskeletal:        General: No tenderness. Normal range of motion.     Cervical back: Neck supple.  Lymphadenopathy:     Cervical: No cervical adenopathy.  Skin:    General: Skin is warm and dry.     Coloration: Skin is not pale.     Findings: No erythema or rash.  Neurological:     General: No focal deficit present.     Mental Status: He is alert and oriented to person, place, and time.     Cranial Nerves: No cranial nerve deficit.     Motor: No abnormal muscle tone.     Coordination: Coordination normal.     Deep Tendon Reflexes: Reflexes are normal and symmetric. Reflexes normal.  Psychiatric:        Behavior: Behavior normal.        Thought Content: Thought content normal.        Judgment: Judgment normal.           Assessment & Plan:  Well exam. We  discussed diet and exercise. Get fasting labs soon.  Gershon Crane, MD

## 2022-01-16 ENCOUNTER — Other Ambulatory Visit: Payer: Self-pay | Admitting: Physician Assistant

## 2022-01-16 DIAGNOSIS — S43004S Unspecified dislocation of right shoulder joint, sequela: Secondary | ICD-10-CM

## 2022-06-01 ENCOUNTER — Encounter: Payer: Self-pay | Admitting: Radiology

## 2022-11-28 ENCOUNTER — Ambulatory Visit (INDEPENDENT_AMBULATORY_CARE_PROVIDER_SITE_OTHER): Payer: Self-pay | Admitting: Family Medicine

## 2022-11-28 ENCOUNTER — Encounter: Payer: Self-pay | Admitting: Family Medicine

## 2022-11-28 VITALS — BP 126/82 | HR 72 | Temp 98.0°F | Wt 149.0 lb

## 2022-11-28 DIAGNOSIS — S93601A Unspecified sprain of right foot, initial encounter: Secondary | ICD-10-CM

## 2022-11-28 DIAGNOSIS — K130 Diseases of lips: Secondary | ICD-10-CM

## 2022-11-28 LAB — HEPATIC FUNCTION PANEL
ALT: 27 U/L (ref 0–53)
AST: 27 U/L (ref 0–37)
Albumin: 4.7 g/dL (ref 3.5–5.2)
Alkaline Phosphatase: 58 U/L (ref 39–117)
Bilirubin, Direct: 0.2 mg/dL (ref 0.0–0.3)
Total Bilirubin: 1.1 mg/dL (ref 0.2–1.2)
Total Protein: 7.8 g/dL (ref 6.0–8.3)

## 2022-11-28 LAB — BASIC METABOLIC PANEL
BUN: 18 mg/dL (ref 6–23)
CO2: 28 meq/L (ref 19–32)
Calcium: 9.8 mg/dL (ref 8.4–10.5)
Chloride: 103 meq/L (ref 96–112)
Creatinine, Ser: 1.45 mg/dL (ref 0.40–1.50)
GFR: 65.06 mL/min (ref >60.00)
Glucose, Bld: 77 mg/dL (ref 70–99)
Potassium: 4.2 meq/L (ref 3.5–5.1)
Sodium: 138 meq/L (ref 135–145)

## 2022-11-28 LAB — HEMOGLOBIN A1C: Hgb A1c MFr Bld: 5.6 % (ref 4.6–6.5)

## 2022-11-28 NOTE — Progress Notes (Signed)
   Subjective:    Patient ID: Erik Yang, male    DOB: Oct 29, 1995, 29 y.o.   MRN: [id number]  HPI Here with 2 issues. First he started having an intermittent pain in the top of his right foot one week ago. No swelling or color changes. No recent trauma, though he has been doing jumping jacks the past few weeks. He has not treated this at all. Also for the past month he says his lips have been darker than normal and they have been chapped and itchy. No pain. He saw urgent care 3 weeks ago and they checked a CBC, which was normal. He has been using Chapstik and Aquaphor balms. There were never any lesions or blisters that he could see.   Review of Systems  Constitutional: Negative.   HENT: Negative.    Eyes: Negative.   Respiratory: Negative.    Cardiovascular: Negative.   Musculoskeletal:  Positive for arthralgias.       Objective:   Physical Exam Constitutional:      Appearance: Normal appearance.  HENT:     Nose: Nose normal.     Mouth/Throat:     Mouth: Mucous membranes are moist.     Pharynx: Oropharynx is clear. No oropharyngeal exudate or posterior oropharyngeal erythema.     Comments: Both lips are somewhat dark in color. No swelling or lesions are seen  Eyes:     Conjunctiva/sclera: Conjunctivae normal.  Musculoskeletal:     Comments: The right foot is normal on exam. No swelling or tenderness   Neurological:     Mental Status: He is alert.           Assessment & Plan:  His right foot pain is likely from a mild sprain he had from doing jumping jacks. He will wear supportive shoes. This should resolve in the next week or so. The etiology of his lip hyperpigmentation is unclear. We will get some labs today to evaluate.  Gershon Crane, MD

## 2022-11-29 LAB — TSH: TSH: 1.52 u[IU]/mL (ref 0.35–5.50)

## 2023-07-09 IMAGING — DX DG SHOULDER 2+V*R*
3 series · 3 of 3 positions shown · non-contrast
Comparison: 07/18/2020

CLINICAL DATA: Shoulder injury.  Status post reduction.

EXAM:
RIGHT SHOULDER - 2+ VIEW

[shoulder ap]
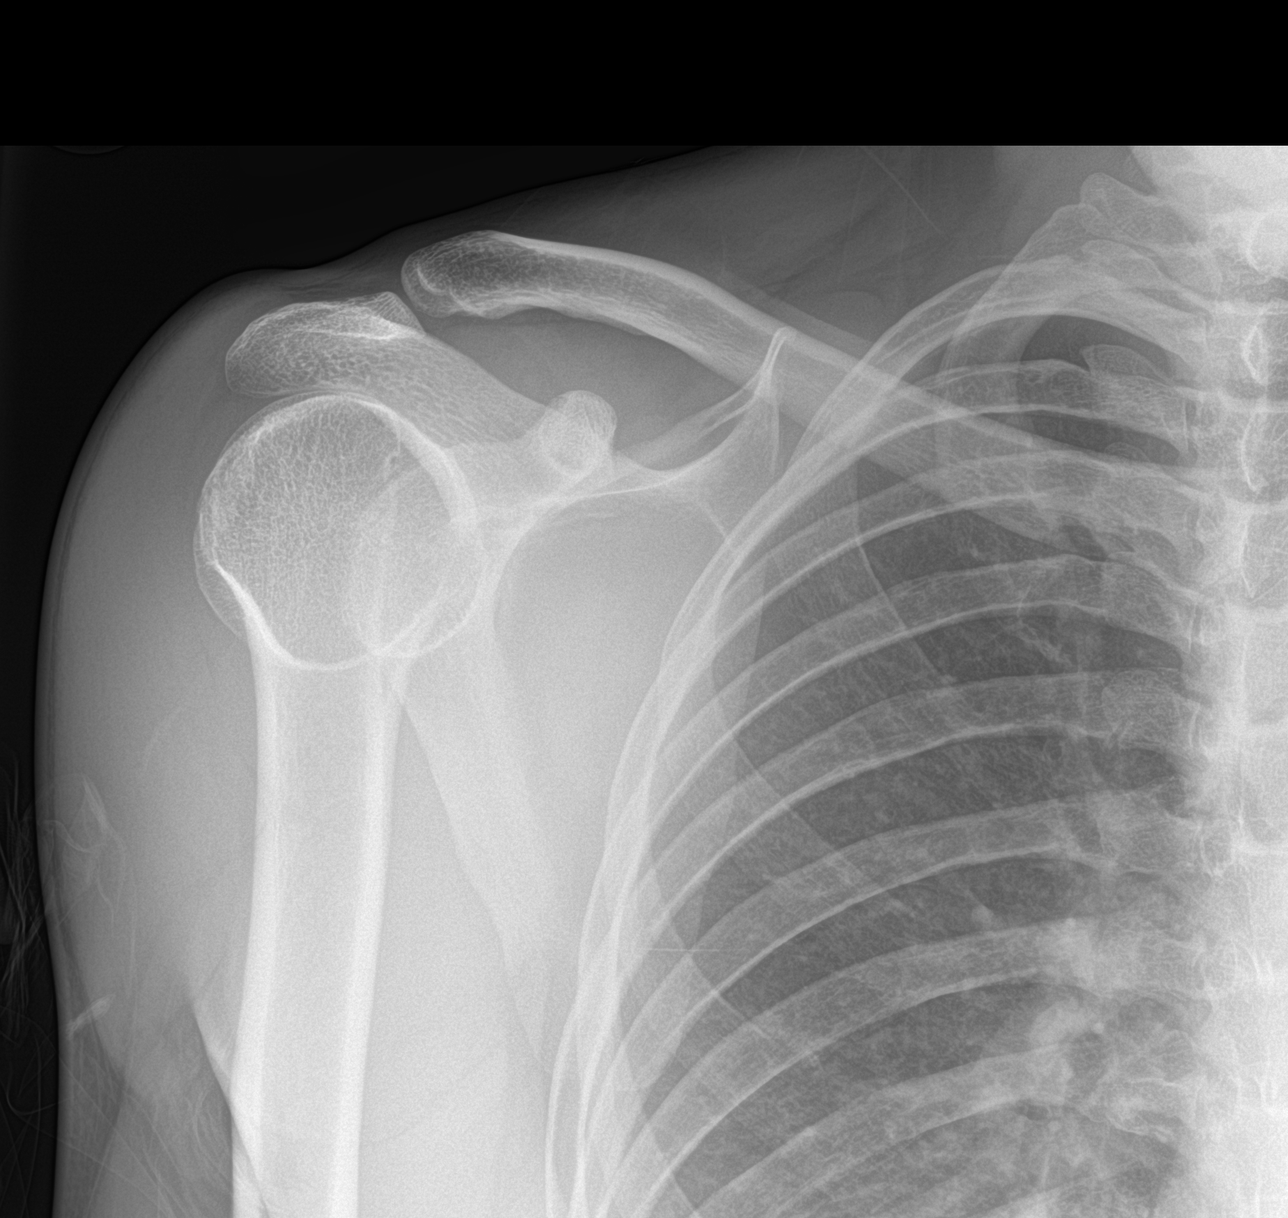

[shoulder obl (1 of 2)]
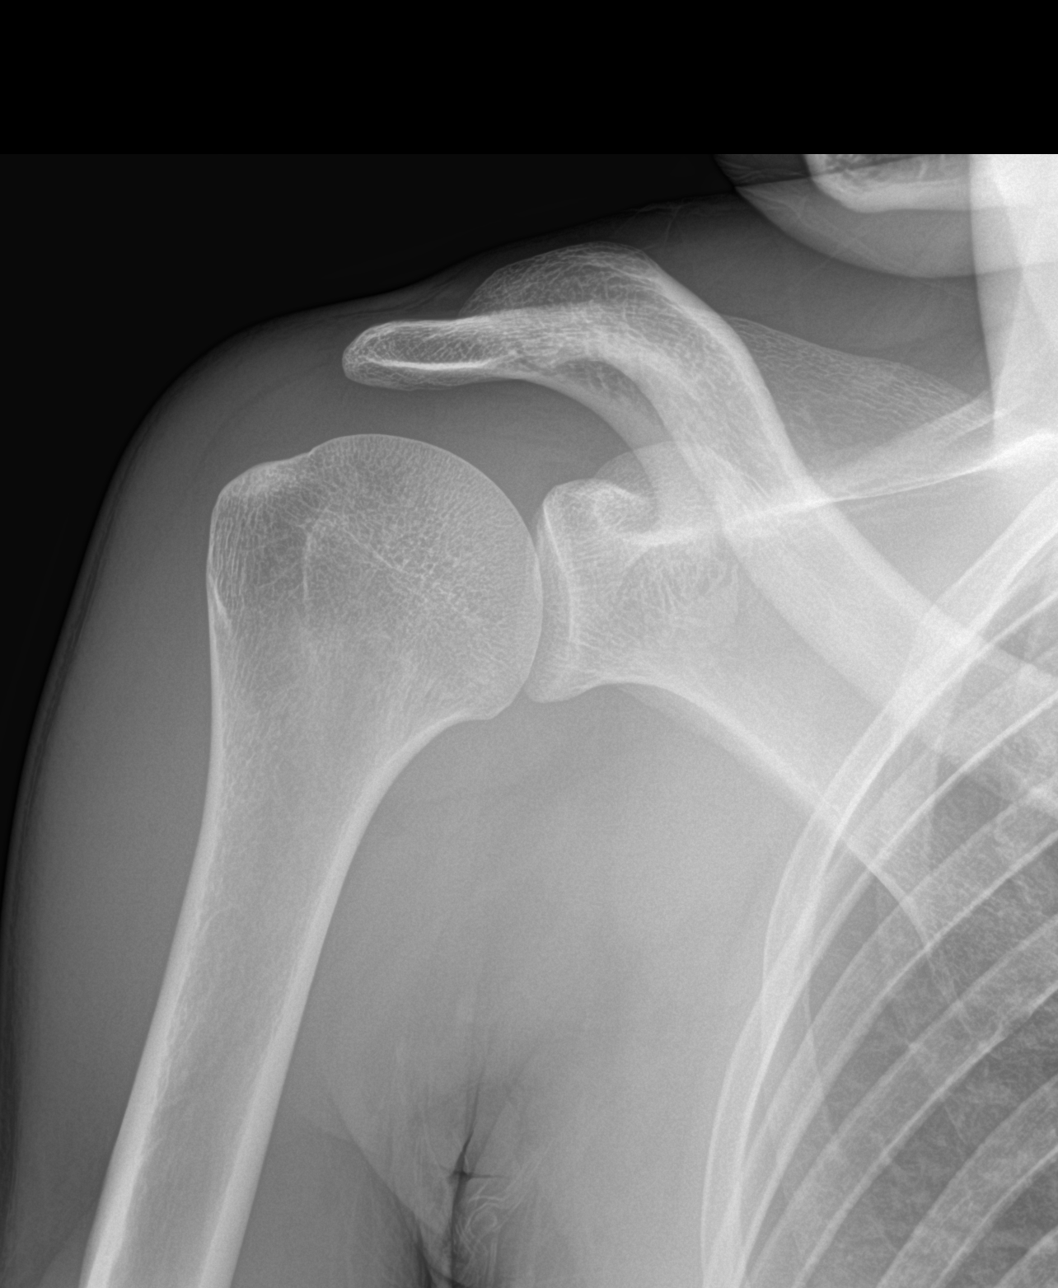

[shoulder obl (2 of 2)]
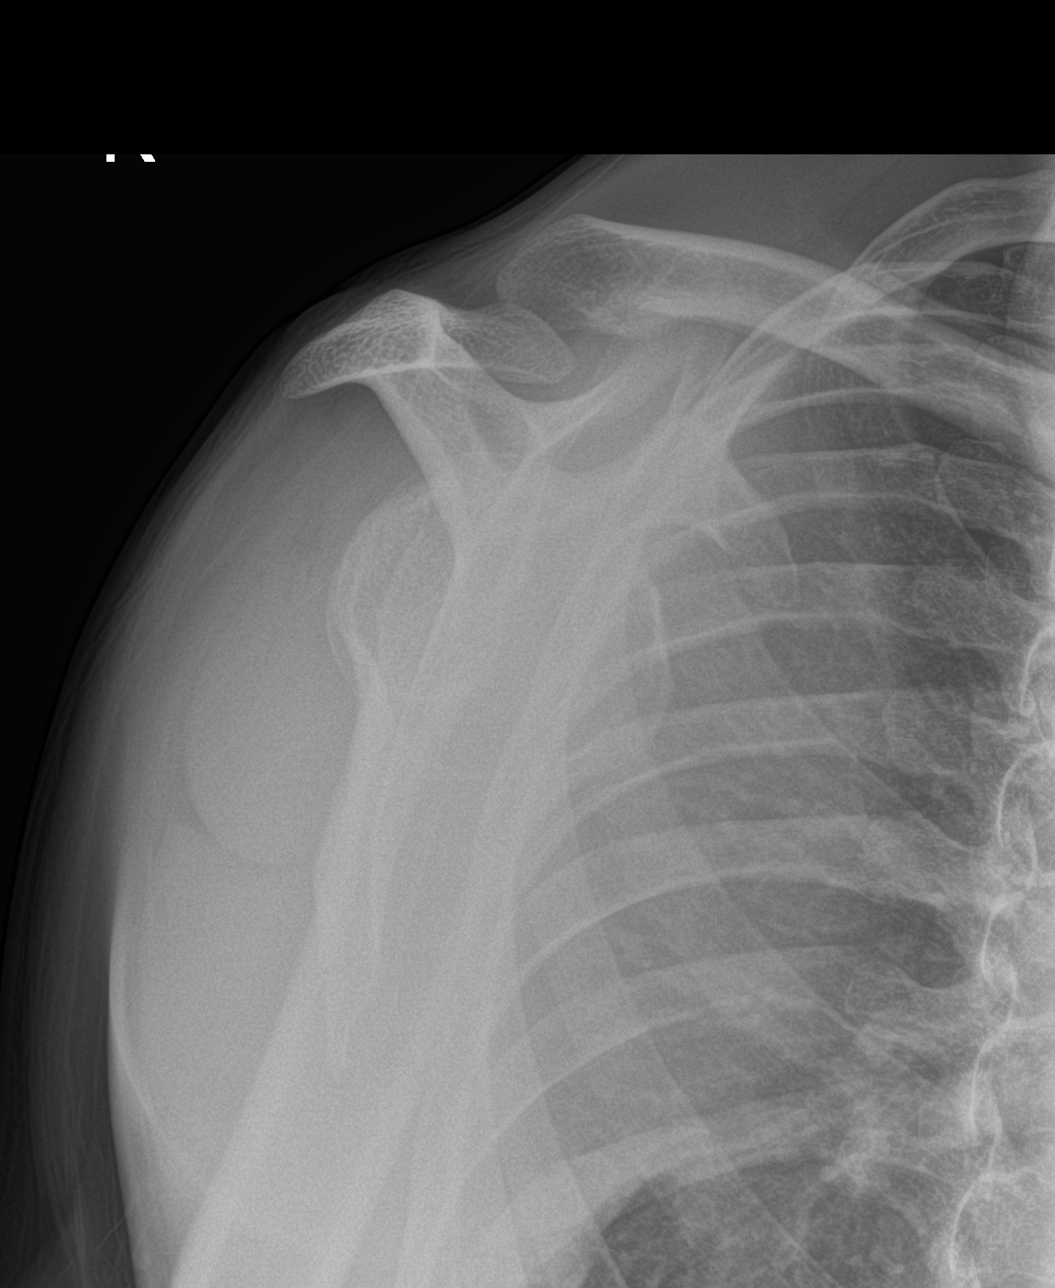

[3 of 3 positions shown; findings below may reference images not displayed]

FINDINGS: There is no evidence of fracture or dislocation. There is no
evidence of arthropathy or other focal bone abnormality. Soft
tissues are unremarkable.
IMPRESSION: Negative.

## 2024-01-22 ENCOUNTER — Encounter (HOSPITAL_COMMUNITY): Payer: Self-pay

## 2024-01-22 ENCOUNTER — Ambulatory Visit (HOSPITAL_COMMUNITY)
Admission: EM | Admit: 2024-01-22 | Discharge: 2024-01-22 | Disposition: A | Discharge status: Home / Self Care | Attending: Emergency Medicine | Admitting: Emergency Medicine

## 2024-01-22 DIAGNOSIS — L0231 Cutaneous abscess of buttock: Secondary | ICD-10-CM

## 2024-01-22 MED ORDER — SULFAMETHOXAZOLE-TRIMETHOPRIM 800-160 MG PO TABS: 1.0000 | ORAL_TABLET | Freq: Two times a day (BID) | ORAL | 0 refills | Status: AC

## 2024-01-22 NOTE — ED Triage Notes (Signed)
 Pt c/o red tender raised area to lt buttocks x2wks. Denies any meds or tx's.

## 2024-01-22 NOTE — Discharge Instructions (Addendum)
 For treatment of the infection causing and abscess on your left buttock, please begin taking Bactrim 1 tablet twice daily for the next 7 days.  If you have not had complete resolution of the redness, swelling and pain after 7 days of treatment or if these become worse within the next 7 days despite treatment, please return for repeat evaluation.  It is not necessary to open and drain the abscess at this time.  If the area does spontaneously opened and drained, please keep the area covered as long as it is draining.  When the area stops draining, you no longer need to cover it.  Thank you for visiting [facility] today.  We appreciate the opportunity to participate in your care.

## 2024-01-22 NOTE — ED Provider Notes (Signed)
 [facility]    CSN: [id number] Arrival date & time: 01/22/24  1203    HISTORY   Chief Complaint  Patient presents with   Abscess   HPI Erik Yang is a pleasant, 28 y.o. male who presents to urgent care today. Patient complains of a lesion on the left lower aspect of his left buttock for the past 2 weeks.  Patient states the area has been getting progressively more tender, more red and is now feeling raised.  Patient states he has not noticed any drainage from the lesion.  Patient states has not tried anything to alleviate his symptoms.  The history is provided by the patient.  Abscess  History reviewed. No pertinent past medical history. Patient Active Problem List   Diagnosis Date Noted   Migraine 01/12/2016   Urinary incontinence 07/14/2013   PTSD (post-traumatic stress disorder) 07/14/2013   INFLUENZA 01/13/2008   CHEST PAIN 04/25/2007   History reviewed. No pertinent surgical history.  Home Medications    Prior to Admission medications   Not on File    Family History History reviewed. No pertinent family history. Social History Social History   Tobacco Use   Smoking status: Never   Smokeless tobacco: Never  Vaping Use   Vaping status: Never Used  Substance Use Topics   Alcohol use: No    Alcohol/week: 0.0 standard drinks of alcohol   Drug use: No   Allergies   Patient has no known allergies.  Review of Systems Review of Systems Pertinent findings revealed after performing a 14 point review of systems has been noted in the history of present illness.  Physical Exam Vital Signs BP 123/79 (BP Location: Right Arm)   Pulse 66   Temp 97.7 F (36.5 C) (Oral)   Resp 16   SpO2 96%   No data found.  Physical Exam Vitals and nursing note reviewed.  Constitutional:      General: He is awake. He is not in acute distress.    Appearance: Normal appearance. He is well-developed and well-groomed. He is not ill-appearing.   Musculoskeletal:       Legs:  Neurological:     Mental Status: He is alert.  Psychiatric:        Behavior: Behavior is cooperative.     Visual Acuity Right Eye Distance:   Left Eye Distance:   Bilateral Distance:    Right Eye Near:   Left Eye Near:    Bilateral Near:     UC Couse / Diagnostics / Procedures:     Radiology No results found.  Procedures Procedures (including critical care time) EKG  Pending results:  Labs Reviewed - No data to display  Medications Ordered in UC: Medications - No data to display  UC Diagnoses / Final Clinical Impressions(s)   I have reviewed the triage vital signs and the nursing notes.  Pertinent labs & imaging results that were available during my care of the patient were reviewed by me and considered in my medical decision making (see chart for details).    Final diagnoses:  Abscess of buttock, left   Lesion is not amenable to drainage at this time given lack of fluctuance.  Patient provided with a 7-day course of Bactrim to take twice daily.  Conservative care recommended.  Return precautions advised.  Please see discharge instructions below for details of plan of care as provided to patient. ED Prescriptions     Medication Sig Dispense Auth. Provider   sulfamethoxazole-trimethoprim (BACTRIM  DS) 800-160 MG tablet Take 1 tablet by mouth 2 (two) times daily for 7 days. 14 tablet Joesph Shaver Scales, PA-C      PDMP not reviewed this encounter.  Pending results:  Labs Reviewed - No data to display    Discharge Instructions      For treatment of the infection causing and abscess on your left buttock, please begin taking Bactrim 1 tablet twice daily for the next 7 days.  If you have not had complete resolution of the redness, swelling and pain after 7 days of treatment or if these become worse within the next 7 days despite treatment, please return for repeat evaluation.  It is not necessary to open and drain the  abscess at this time.  If the area does spontaneously opened and drained, please keep the area covered as long as it is draining.  When the area stops draining, you no longer need to cover it.  Thank you for visiting [facility] today.  We appreciate the opportunity to participate in your care.      Disposition Upon Discharge:  Condition: stable for discharge home  Patient presented with an acute illness with associated systemic symptoms and significant discomfort requiring urgent management. In my opinion, this is a condition that a prudent lay person (someone who possesses an average knowledge of health and medicine) may potentially expect to result in complications if not addressed urgently such as respiratory distress, impairment of bodily function or dysfunction of bodily organs.   Routine symptom specific, illness specific and/or disease specific instructions were discussed with the patient and/or caregiver at length.   As such, the patient has been evaluated and assessed, work-up was performed and treatment was provided in alignment with urgent care protocols and evidence based medicine.  Patient/parent/caregiver has been advised that the patient may require follow up for further testing and treatment if the symptoms continue in spite of treatment, as clinically indicated and appropriate.  Patient/parent/caregiver has been advised to return to the [facility] or PCP if no better; to PCP or the Emergency Department if new signs and symptoms develop, or if the current signs or symptoms continue to change or worsen for further workup, evaluation and treatment as clinically indicated and appropriate  The patient will follow up with their current PCP if and as advised. If the patient does not currently have a PCP we will assist them in obtaining one.   The patient may need specialty follow up if the symptoms continue, in spite of conservative treatment and management, for further workup,  evaluation, consultation and treatment as clinically indicated and appropriate.  Patient/parent/caregiver verbalized understanding and agreement of plan as discussed.  All questions were addressed during visit.  Please see discharge instructions below for further details of plan.  This office note has been dictated using Teaching laboratory technician.  Unfortunately, this method of dictation can sometimes lead to typographical or grammatical errors.  I apologize for your inconvenience in advance if this occurs.  Please do not hesitate to reach out to me if clarification is needed.      Joesph Shaver Scales, PA-C 01/22/24 1327
